# Patient Record
Sex: Female | Born: 1975 | Race: White | Hispanic: No | Marital: Single | Smoking: Never smoker
Health system: Southern US, Community
[De-identification: ages and names within clinical notes are randomized; demographics above are authoritative.]

## PROBLEM LIST (undated history)

## (undated) DIAGNOSIS — N809 Endometriosis, unspecified: Secondary | ICD-10-CM

## (undated) HISTORY — PX: TONSILLECTOMY: SUR1361

## (undated) HISTORY — PX: BUNIONECTOMY: SHX129

## (undated) HISTORY — DX: Endometriosis, unspecified: N80.9

---

## 2003-10-31 ENCOUNTER — Ambulatory Visit (HOSPITAL_COMMUNITY): Admission: RE | Admit: 2003-10-31 | Discharge: 2003-10-31 | Payer: Self-pay | Admitting: Internal Medicine

## 2004-08-26 ENCOUNTER — Ambulatory Visit: Payer: Self-pay | Admitting: Internal Medicine

## 2004-08-30 ENCOUNTER — Ambulatory Visit (HOSPITAL_COMMUNITY): Admission: RE | Admit: 2004-08-30 | Discharge: 2004-08-30 | Payer: Self-pay | Admitting: Internal Medicine

## 2005-03-19 ENCOUNTER — Ambulatory Visit: Payer: Self-pay | Admitting: Internal Medicine

## 2006-04-16 ENCOUNTER — Ambulatory Visit: Payer: Self-pay | Admitting: Internal Medicine

## 2006-04-28 ENCOUNTER — Ambulatory Visit: Payer: Self-pay | Admitting: Internal Medicine

## 2009-01-21 ENCOUNTER — Emergency Department (HOSPITAL_COMMUNITY): Admission: EM | Admit: 2009-01-21 | Discharge: 2009-01-21 | Payer: Self-pay | Admitting: Emergency Medicine

## 2009-05-23 ENCOUNTER — Encounter: Admission: RE | Admit: 2009-05-23 | Discharge: 2009-05-23 | Payer: Self-pay | Admitting: Specialist

## 2010-02-05 ENCOUNTER — Ambulatory Visit (HOSPITAL_COMMUNITY): Admission: RE | Admit: 2010-02-05 | Discharge: 2010-02-05 | Payer: Self-pay | Admitting: Family Medicine

## 2010-03-17 HISTORY — PX: TUBAL LIGATION: SHX77

## 2010-06-03 ENCOUNTER — Other Ambulatory Visit (HOSPITAL_COMMUNITY): Payer: Self-pay | Admitting: Family Medicine

## 2010-06-03 ENCOUNTER — Ambulatory Visit (HOSPITAL_COMMUNITY)
Admission: RE | Admit: 2010-06-03 | Discharge: 2010-06-03 | Disposition: A | Discharge status: Home / Self Care | Payer: BC Managed Care – PPO | Source: Ambulatory Visit | Attending: Family Medicine | Admitting: Family Medicine

## 2010-06-03 DIAGNOSIS — R52 Pain, unspecified: Secondary | ICD-10-CM

## 2010-06-03 DIAGNOSIS — M25559 Pain in unspecified hip: Secondary | ICD-10-CM | POA: Insufficient documentation

## 2010-08-02 NOTE — Op Note (Signed)
NAME:  Connie Burgess, Connie Burgess                       ACCOUNT NO.:  [id number]   MEDICAL RECORD NO.:  [id number]                   PATIENT TYPE:  AMB   LOCATION:  DAY                                  FACILITY:  [facility]   PHYSICIAN:  Lionel December, M.D.                 DATE OF BIRTH:  [birth year]   DATE OF PROCEDURE:  10/31/2003  DATE OF DISCHARGE:                                 OPERATIVE REPORT   PROCEDURE:  Flexible sigmoidoscopy.   INDICATIONS:  Forest is a 34 year old Caucasian female with intermittent  hematochezia over the last few weeks. She history of several episodes 5  years ago. She had colonoscopy which revealed erosions at the rectum and  sigmoid colon, but histology was negative for inflammatory bowel disease.  She is undergoing diagnostic flexible sigmoidoscopy. Procedure was reviewed  with the patient and informed consent was obtained.   PREOPERATIVE MEDICATIONS:  None.   FINDINGS:  Procedure performed in endoscopy suite. The patient was placed in  the left lateral position and rectal examination performed. No abnormality  noted on external or digital exam. Olympus video scope was placed in the  rectum. Rectal mucosa was normal; however, there were few tiny erosions  throughout the rectosigmoid junction. Scope was passed to the sigmoid colon.  Preparation was excellent until 45 cm where formed stool was noted. Mucosa  of the sigmoid colon reveals normal mucosa pattern. Most of the rectal  mucosa was also normal. Scope was retroflexed to examine anorectal junction.  While they were no hemorrhoids, there was erythema and some granularity at  the anorectal junction without obvious mucosal disruption. Endoscope was  straightened and withdrawn. The patient tolerated the procedure well.   FINAL DIAGNOSES:  Few tiny erosions in the rectosigmoid junction and some  mucosa irritation at anorectal junction. Suspect bleeding from either of  these could account for hematochezia.   RECOMMENDATIONS:  1. _____________ suppository 1 g per rectum q.h.s. for 2 weeks. If     necessary, she can extend it to 30 days.  2. I will be contacting patient with biopsy results for further     recommendations.  3. Citrucel 1 tablespoonful daily.  4. She will keep rigid diarrhea as to the frequency of these episodes.     Frequency of these episodes are reviewed with [place] in 3 to 6 months.      ___________________________________________                                            Lionel December, M.D.   NR/MEDQ  D:  10/31/2003  T:  10/31/2003  Job:  [id number]   cc:   Lorin Picket A. Gerda Diss, M.D.  [address]  [place]  [place] [address]  Fax: [phone]

## 2010-08-02 NOTE — Consult Note (Signed)
NAME:  Connie Burgess, KEESEY                       ACCOUNT NO.:  [id number]   MEDICAL RECORD NO.:  [id number]                   PATIENT TYPE:  AMB   LOCATION:  DAY                                  FACILITY:  [facility]   PHYSICIAN:  Lionel December, M.D.                 DATE OF BIRTH:  01/01/1976   DATE OF CONSULTATION:  DATE OF DISCHARGE:                                   CONSULTATION   REQUESTING PHYSICIAN:  Dr. Ralph Dowdy.   REASON FOR CONSULTATION:  Rectal bleeding.   HISTORY OF PRESENT ILLNESS:  Connie Burgess is a 35 year old female who reports  to our office for evaluation of rectal bleeding. She notes about a month ago  she was dripping blood. She states it was a large amount of bright red  bleeding on 3 to 4 separate episodes. She denies any history of constipation  or straining. She denies any history of known hemorrhoids. She denies any  diarrhea or abdominal pain. She does have some rectal pruritus. She denies  any fevers or chills. She denies any nausea, vomiting, heartburn,  indigestion, dysphagia, or odynophagia.   PAST MEDICAL HISTORY:  Positive for migraine headache as well as thyroid  disease. Last colonoscopy was by Dr. Karilyn Cota December 04, 1998. She did have  some nodularity to the transverse ileum felt to be normal as well as some  punctate erosions involving the rectal and distal sigmoid colon with  possible nonspecific colitis or early inflammatory bowel disease. Biopsy  from the small bowel revealed mild increased chronic inflammation and from  the sigmoid revealed focal increasing chronic inflammation with a lymphoid  follicle as well as scattered acute inflammation and focal edema.   PAST MEDICAL HISTORY:  1. Tonsillectomy as a child.  2. Left foot bunionectomy last year.   CURRENT MEDICATIONS:  _____________ birth control pills. Synthroid 1.5 mg  daily. Topamax 50 mg b.i.d.   ALLERGIES:  CEPHALEXIN.   FAMILY HISTORY:  No known family history of inflammatory bowel  disease or  colorectal carcinoma. Both mother and father are alive and are healthy. She  has two healthy sisters and one healthy brother.   SOCIAL HISTORY:  Ms. Faust has been married for 5 years. She lives at home  with her 50-year-old daughter as well as her husband and father-in-law. She  is employed full time as a Manufacturing systems engineer. She denies any tobacco use.  She does report occasional alcohol use once every 2 weeks. She denies any  drug use.   REVIEW OF SYSTEMS:  CONSTITUTIONAL:  Weight is stable. Appetite is good.  Denies any fatigue. ENDOCRINE:  She does have a history of hypothyroidism.  PSYCHOSOCIAL:  She does state that she is under a lot of stress recently,  especially with her father-in-law residing with her. She denies any anxiety  or depression. GASTROINTESTINAL:  See HPI. CARDIOVASCULAR:  She denies any  chest pain or palpitations. PULMONARY:  She  denies any shortness of breath,  dyspnea, cough, or hemoptysis.   PHYSICAL EXAMINATION:  VITAL SIGNS:  Weight 228.5 pounds, height 61 inches,  blood pressure 100/80, pulse 70.  GENERAL:  Connie Burgess is an obese, 35 year old, Caucasian female who is  alert and oriented, pleasant and cooperative in no acute distress.  HEENT:  Sclerae clear, nonicteric. Conjunctivae pink. Oropharynx pink and  moist without any lesions.  NECK:  Supple without any masses or thyromegaly.  CHEST:  Heart regular rate and rhythm with normal S1 and S2 without any  murmurs, clicks, rubs, or gallop.  LUNGS:  Clear to auscultation bilaterally.  ABDOMEN:  Protuberant and obese with positive bowel sounds x4. Nontender,  soft, nondistended without palpable mass or hepatosplenomegaly. No rebound  tenderness or guarding.  EXTREMITIES:  Trace edema bilaterally. Good pulses bilaterally.  RECTAL:  No external lesion noted. Good sphincter tone. No internal masses  or lesions palpated. There was small amount of light brown hemoccult  negative stool obtained  from the vault.   ASSESSMENT:  Connie Burgess is a 35 year old Caucasian female with intermittent  large volume bright red rectal bleeding on at least three or four occasions  in the last month. There is no associated abdominal pain. Colonoscopy and  biopsies from 2000 were very nonspecific. Bleeding may be a result of benign  anorectal source such as internal hemorrhoids; however, given her history of  this, I feel that inflammatory bowel disease should be ruled out as well. We  will ask Dr. Karilyn Cota to take another look at her colon at this time.   RECOMMENDATIONS:  1. Will schedule flexible sigmoidoscopy in the near future with Dr. Karilyn Cota.  2. Prescription is given for Anusol HC suppositories 1 per rectum q.h.s. for     14 days with one refill.  3. Further recommendations pending procedure.   We would like to thank Dr. Ralph Dowdy for allowing [place] to participate in the care  of [name].     ________________________________________  ___________________________________________  Nicholas Lose, N.P.                  Lionel December, M.D.   KC/MEDQ  D:  10/31/2003  T:  10/31/2003  Job:  [id number]   cc:   Ralph Dowdy, M.D.  [place]

## 2011-01-28 ENCOUNTER — Institutional Professional Consult (permissible substitution): Payer: BC Managed Care – PPO | Admitting: Pulmonary Disease

## 2011-02-04 ENCOUNTER — Ambulatory Visit (INDEPENDENT_AMBULATORY_CARE_PROVIDER_SITE_OTHER): Payer: BC Managed Care – PPO | Admitting: Pulmonary Disease

## 2011-02-04 ENCOUNTER — Encounter: Payer: Self-pay | Admitting: Pulmonary Disease

## 2011-02-04 ENCOUNTER — Ambulatory Visit: Payer: BC Managed Care – PPO | Attending: Pulmonary Disease | Admitting: Sleep Medicine

## 2011-02-04 VITALS — BP 110/62 | HR 112 | Temp 98.1°F | Ht 61.0 in | Wt 250.6 lb

## 2011-02-04 DIAGNOSIS — R0683 Snoring: Secondary | ICD-10-CM

## 2011-02-04 DIAGNOSIS — R0989 Other specified symptoms and signs involving the circulatory and respiratory systems: Secondary | ICD-10-CM

## 2011-02-04 DIAGNOSIS — G4733 Obstructive sleep apnea (adult) (pediatric): Secondary | ICD-10-CM | POA: Insufficient documentation

## 2011-02-04 DIAGNOSIS — R0609 Other forms of dyspnea: Secondary | ICD-10-CM

## 2011-02-04 NOTE — Progress Notes (Signed)
  Subjective:    Patient ID: Connie Burgess, female    DOB: 06-23-1975, 35 y.o.   MRN: [id number]  HPI The patient is a 35 year old female who I have been asked to see for possible sleep apnea.  The patient states that she has been told by her husband that she is a very loud snorer, and also has heard gurgling noises during sleep.  Despite losing 25 pounds, the patient states that her snoring has actually gotten worse rather than better.  She also notes that she can go periods without snoring as well.  The patient does not have a Burgess of awakenings during the night, but 50% of the time is unrested upon arising.  The patient feels that her alertness during the day is fairly normal, but does feel fatigued.  She denies any issues in the evenings watching television, or with driving.  The patient's Epworth score today is only 3.  Sleep Questionnaire: What time do you typically go to bed?( Between what hours) 9 to 10 pm How long does it take you to fall asleep? 10 to 20 mins How many times during the night do you wake up? 1 What time do you get out of bed to start your day? 8413 Do you drive or operate heavy machinery in your occupation? No How much has your weight changed (up or down) over the past two years? (In pounds) 25 lb (11.34 kg) Have you ever had a sleep study before? No Do you currently use CPAP? No Do you wear oxygen at any time? No     Review of Systems  Constitutional: Negative for fever and unexpected weight change.  HENT: Positive for ear pain, congestion, sore throat and sneezing. Negative for nosebleeds, rhinorrhea, trouble swallowing, dental problem, postnasal drip and sinus pressure.   Eyes: Negative for redness and itching.  Respiratory: Negative for cough, chest tightness, shortness of breath and wheezing.   Cardiovascular: Negative for palpitations and leg swelling.  Gastrointestinal: Negative for nausea and vomiting.  Genitourinary: Negative for dysuria.  Musculoskeletal:  Negative for joint swelling.  Skin: Negative for rash.  Neurological: Negative for headaches.  Hematological: Does not bruise/bleed easily.  Psychiatric/Behavioral: Negative for dysphoric mood. The patient is not nervous/anxious.        Objective:   Physical Exam Constitutional: obese female, no acute distress  HENT:  Nares patent without discharge, but deviated septum to left with significant narrowing.  Oropharynx without exudate, uvula is normal, but palate moderately elongated.   Eyes:  Perrla, eomi, no scleral icterus  Neck:  No JVD, no TMG  Cardiovascular:  Normal rate, regular rhythm, no rubs or gallops.  No murmurs        Intact distal pulses  Pulmonary :  Normal breath sounds, no stridor or respiratory distress   No rales, rhonchi, or wheezing  Abdominal:  Soft, nondistended, bowel sounds present.  No tenderness noted.   Musculoskeletal:  trace lower extremity edema noted.  Lymph Nodes:  No cervical lymphadenopathy noted  Skin:  No cyanosis noted  Neurologic:  Alert, appropriate, moves all 4 extremities without obvious deficit.         Assessment & Plan:

## 2011-02-04 NOTE — Assessment & Plan Note (Signed)
The pt has loud snoring by history, and nonrestorative sleep at times.  She has daytime fatigue that she feels is related to her job, but does not describe significant daytime sleepiness with inactivity.  She is obese and has an abnormal upper airway anatomy.  It is really unclear whether she truly has obstructive sleep apnea, or possibly the upper airway resistance syndrome.  The only way to know is to proceed with a sleep study, and the patient feels this is bothering her enough to do this.  I have also encouraged her to work aggressively on weight loss.

## 2011-02-04 NOTE — Patient Instructions (Signed)
Will set up for a sleep study.  Will call to discuss when results are available Work on weight reduction

## 2011-02-22 DIAGNOSIS — G4733 Obstructive sleep apnea (adult) (pediatric): Secondary | ICD-10-CM

## 2011-02-22 NOTE — Procedures (Signed)
Connie Burgess, Connie Burgess             ACCOUNT NO.:  [id number]  MEDICAL RECORD NO.:  [id number]          PATIENT TYPE:  OUT  LOCATION:  SLEEP LAB                     FACILITY:  [facility]  PHYSICIAN:  Barbaraann Share, MD,FCCPDATE OF BIRTH:  02/25/1976  DATE OF STUDY:  02/04/2011                           NOCTURNAL POLYSOMNOGRAM  REFERRING PHYSICIAN:  Barbaraann Share, MD,FCCP  REFERRING PHYSICIAN:  Barbaraann Share, MD, FCCP  INDICATION FOR STUDY:  Hypersomnia with sleep apnea.  EPWORTH SLEEPINESS SCORE:  3.  MEDICATIONS:  SLEEP ARCHITECTURE:  The patient had a total sleep time of 310 minutes with no slow-wave sleep and only 35 minutes of REM.  Sleep onset latency was normal at 8 minutes, and REM onset was prolonged at 165 minutes. Sleep efficiency was mildly reduced at 85%.  RESPIRATORY DATA:  The patient was found to have 19 obstructive apneas and 31 obstructive hypopneas, giving her an apnea-hypopnea index of 10 events per hour.  The events occurred in all body positions, but were more prominent in the supine position.  There was very loud snoring noted throughout.  The patient was also noted to have large numbers of respiratory effort related arousals, giving her a respiratory disturbance index of 23 events per hour.  OXYGEN DATA:  There was O2 desaturation as low as 81% with the patient's obstructive events.  CARDIAC DATA:  No clinically significant arrhythmias were noted.  MOVEMENT-PARASOMNIA:  The patient had no significant leg jerks or other abnormal behaviors.  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  There was O2 desaturation as low as 81%.  Treatment for this degree of sleep apnea can include a trial of weight loss alone, upper airway surgery, dental appliance, and also a CPAP.  Clinical correlation is  suggested.     Barbaraann Share, MD,FCCP Diplomate, American Board of Sleep Medicine Electronically Signed 02/25/2011 13:24:05    [facility]/MEDQ  D:  02/22/2011 17:30:47  T:  02/22/2011 21:37:06  Job:  [id number]

## 2011-02-25 ENCOUNTER — Telehealth: Payer: Self-pay | Admitting: *Deleted

## 2011-02-25 NOTE — Telephone Encounter (Signed)
Per KC, pt needs ov with him to discuss sleep study results.  LMOM for pt TCB 

## 2011-02-26 NOTE — Telephone Encounter (Signed)
Pt scheduled to see [facility] 03/23/10

## 2011-02-26 NOTE — Telephone Encounter (Signed)
LMOM for pt TCB 

## 2011-03-07 HISTORY — PX: INTRAUTERINE DEVICE INSERTION: SHX323

## 2011-03-24 ENCOUNTER — Encounter: Payer: Self-pay | Admitting: Pulmonary Disease

## 2011-03-24 ENCOUNTER — Ambulatory Visit (INDEPENDENT_AMBULATORY_CARE_PROVIDER_SITE_OTHER): Payer: BC Managed Care – PPO | Admitting: Pulmonary Disease

## 2011-03-24 VITALS — BP 122/82 | HR 89 | Temp 98.5°F | Ht 61.0 in | Wt 255.8 lb

## 2011-03-24 DIAGNOSIS — G4733 Obstructive sleep apnea (adult) (pediatric): Secondary | ICD-10-CM

## 2011-03-24 NOTE — Assessment & Plan Note (Signed)
The patient has mild obstructive sleep apnea by her recent sleep study, but clearly has disrupted sleep and daytime symptoms.  I have outlined an aggressive approach with upper airway surgery, dental appliance, and CPAP, while working on weight loss.  I have also discussed with her a more conservative approach which would be a trial of weight loss alone for the next 6 months.  After a long discussion, the patient is willing to give CPAP a try. I will set the patient up on cpap at a moderate pressure level to allow for desensitization, and will troubleshoot the device over the next 4-6weeks if needed.  The pt is to call me if having issues with tolerance.  Will then optimize the pressure once patient is able to wear cpap on a consistent basis.

## 2011-03-24 NOTE — Patient Instructions (Signed)
Will start on cpap.  Please call if having issues with tolerance Work on weight loss followup with me in 5weeks. 

## 2011-03-24 NOTE — Progress Notes (Signed)
  Subjective:    Patient ID: Connie Burgess, female    DOB: October 14, 1975, 36 y.o.   MRN: [id number]  HPI The patient comes in today for follow up after her sleep study in November.  She was found to have mild obstructive sleep apnea, with an AHI of 10 events per hour and an RDI of 23 events per hour.  I've reviewed the study in detail with her, and answered all of her questions.   Review of Systems  Constitutional: Negative for fever and unexpected weight change.  HENT: Positive for sinus pressure. Negative for ear pain, nosebleeds, congestion, sore throat, rhinorrhea, sneezing, trouble swallowing, dental problem and postnasal drip.   Eyes: Negative for redness and itching.  Respiratory: Positive for cough. Negative for chest tightness, shortness of breath and wheezing.   Cardiovascular: Negative for palpitations and leg swelling.  Gastrointestinal: Negative for nausea and vomiting.  Genitourinary: Negative for dysuria.  Musculoskeletal: Negative for joint swelling.  Skin: Negative for rash.  Neurological: Positive for headaches.  Hematological: Does not bruise/bleed easily.  Psychiatric/Behavioral: Negative for dysphoric mood. The patient is not nervous/anxious.        Objective:   Physical Exam Obese female in no acute distress Nose without purulence or discharge noted Lower extremities without edema, no cyanosis noted Alert and oriented, does not appear to be overly sleepy, moves all 4 extremities.       Assessment & Plan:

## 2011-04-09 IMAGING — CT CT ABD-PELV W/ CM
2 of 4 series · 17 of 46 positions shown, 19 images · IV contrast (Omnipaque 300)
Comparison: None.

CLINICAL DATA: Abdominal pain.  Pelvic pain.

CT ABDOMEN AND PELVIS WITH CONTRAST
TECHNIQUE: Multidetector CT imaging of the abdomen and pelvis was
performed following the standard protocol during bolus
administration of intravenous contrast.
Contrast: 100 ml Umnipaque-MAA.

[Series 2: abd_pel_with 5.0 b40f · axial · 0.84mm/px · z∈[-521,-81]mm · 14 of 97 slices shown, 16 images]
[im 5/97  soft-tissue]
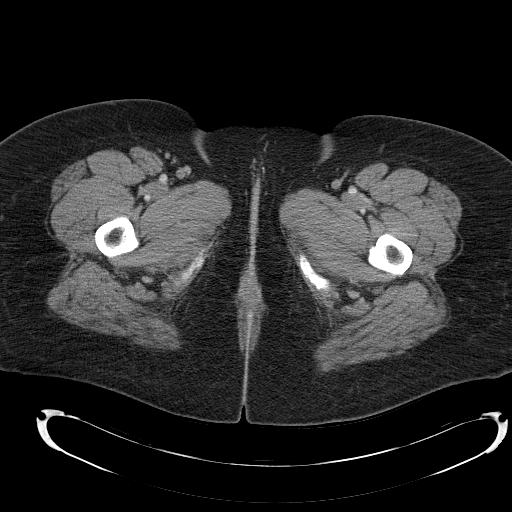
[im 5/97  bone]
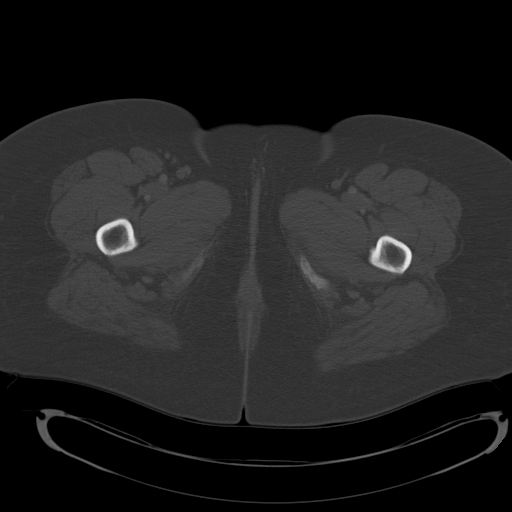
[im 13/97  soft-tissue]
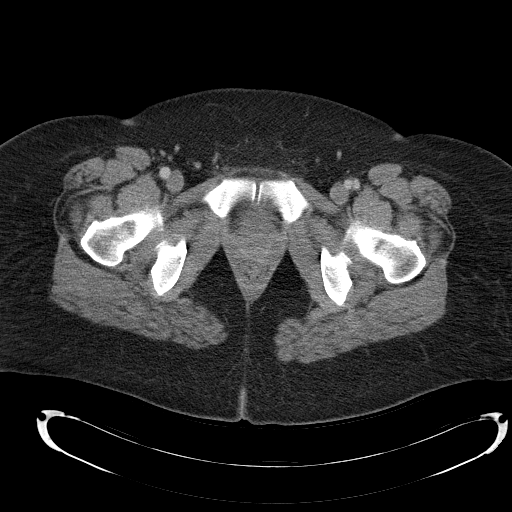
[im 21/97  soft-tissue]
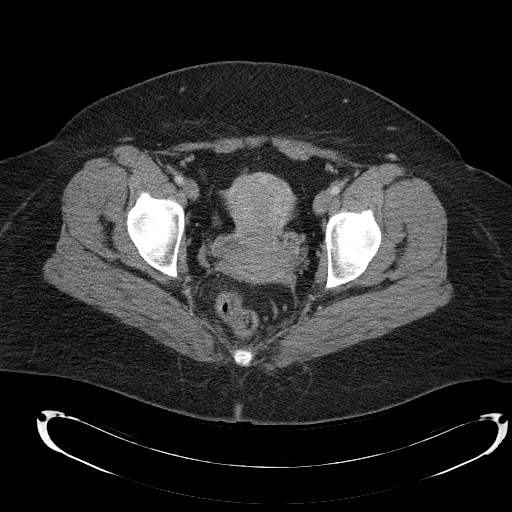
[im 25/97  soft-tissue]
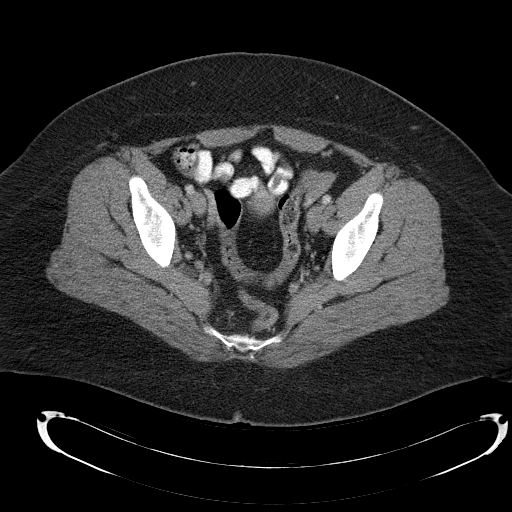
[im 33/97  soft-tissue]
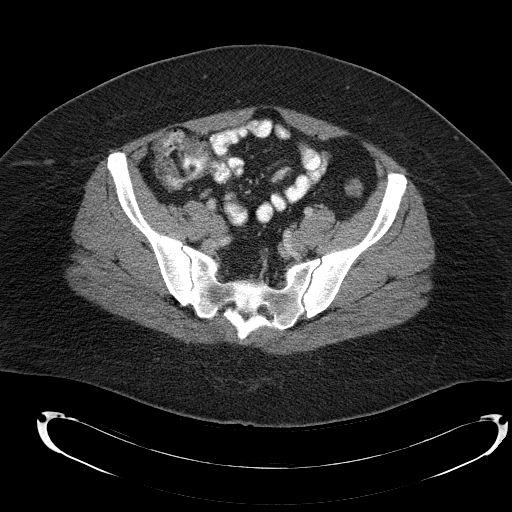
[im 41/97  soft-tissue]
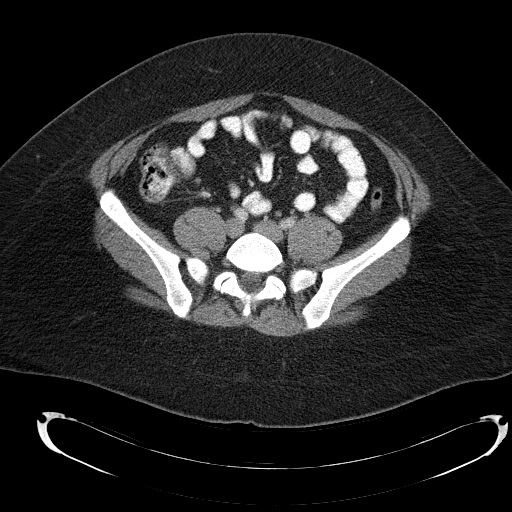
[im 45/97  soft-tissue]
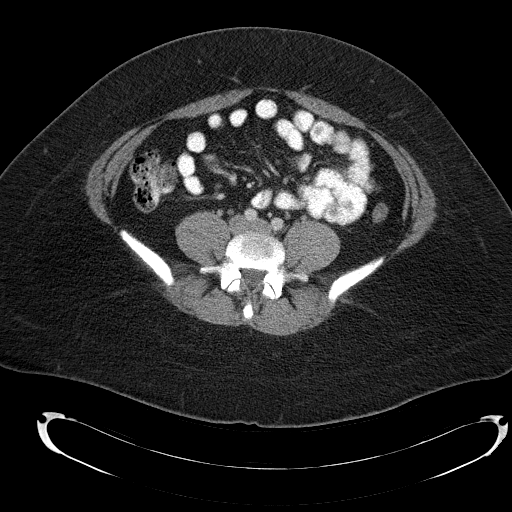
[im 53/97  soft-tissue]
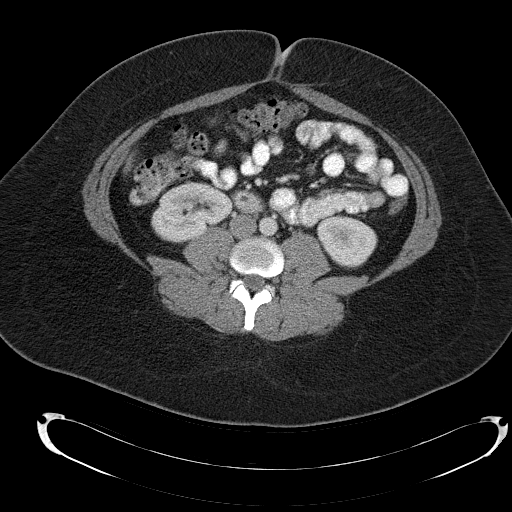
[im 57/97  soft-tissue]
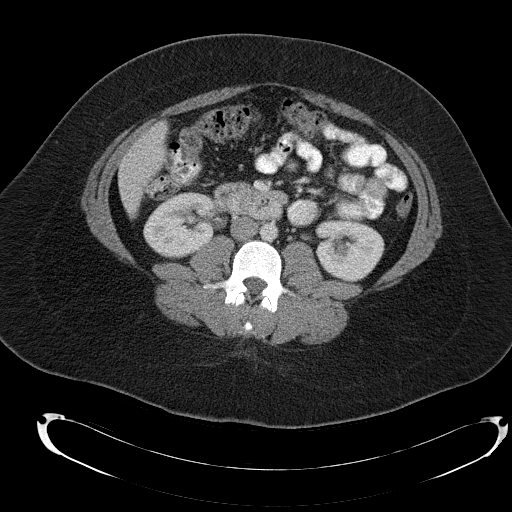
[im 57/97  bone]
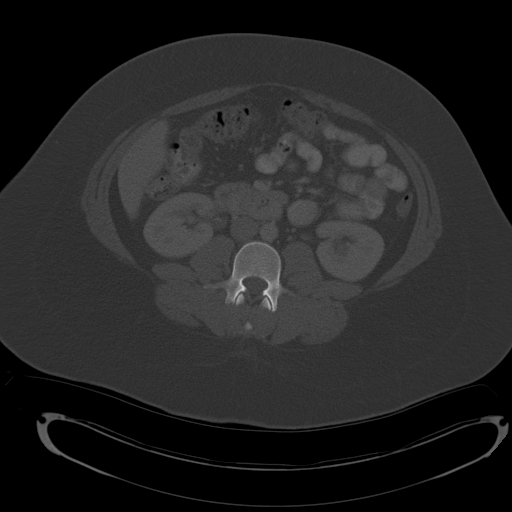
[im 65/97  soft-tissue]
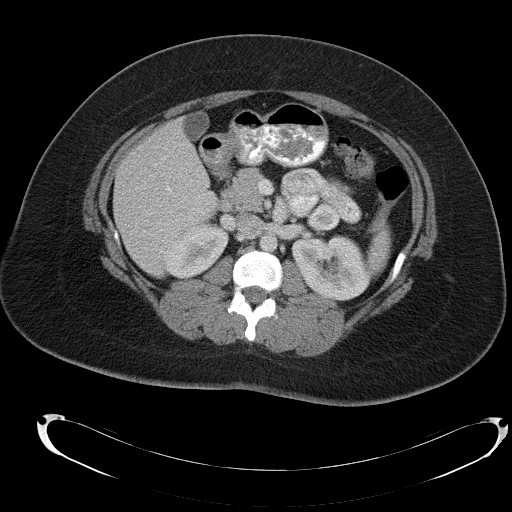
[im 73/97  soft-tissue]
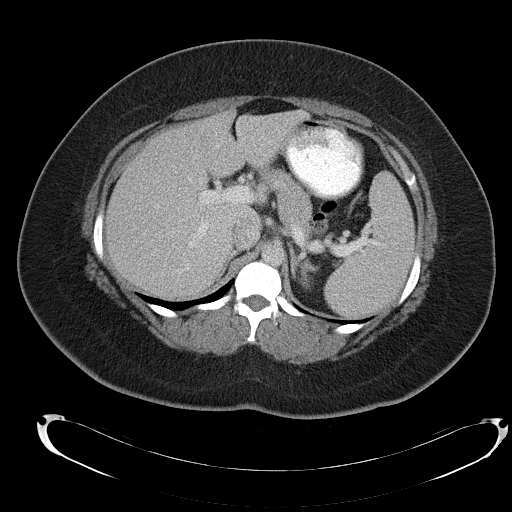
[im 77/97  soft-tissue]
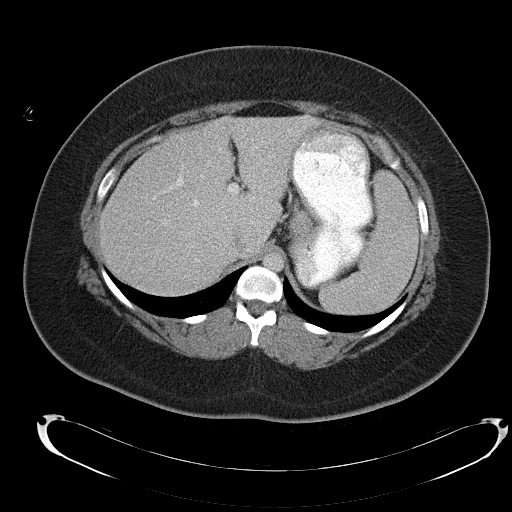
[im 85/97  soft-tissue]
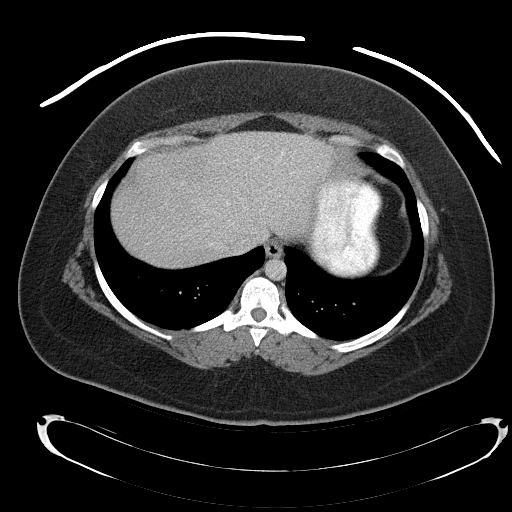
[im 93/97  soft-tissue]
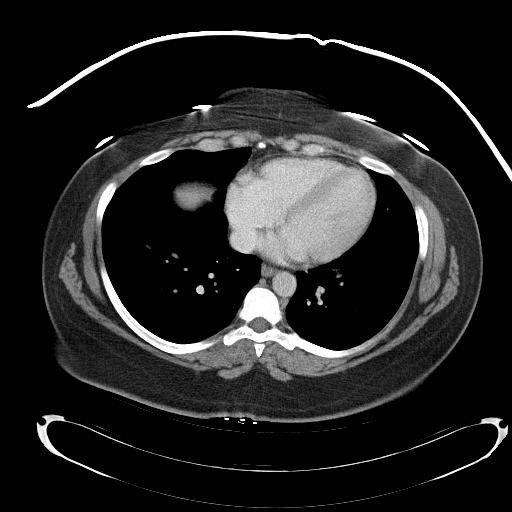

[Series 4: abd_pel_with 3.0 spo cor · coronal · 0.75mm/px · 3 of 99 slices shown]
[im 33/99  soft-tissue]
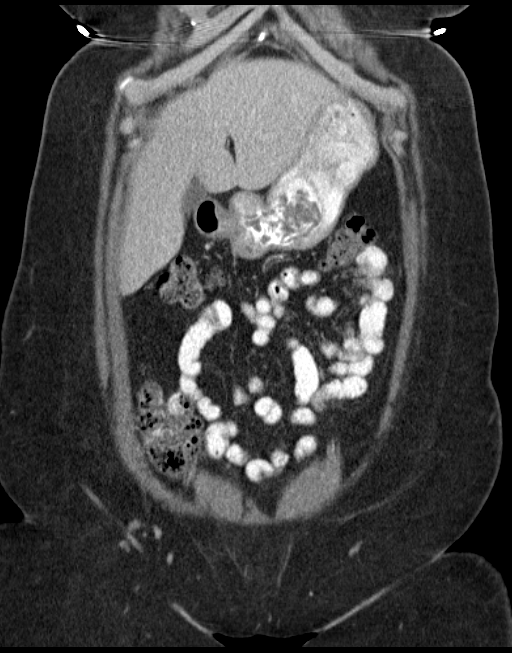
[im 44/99  soft-tissue]
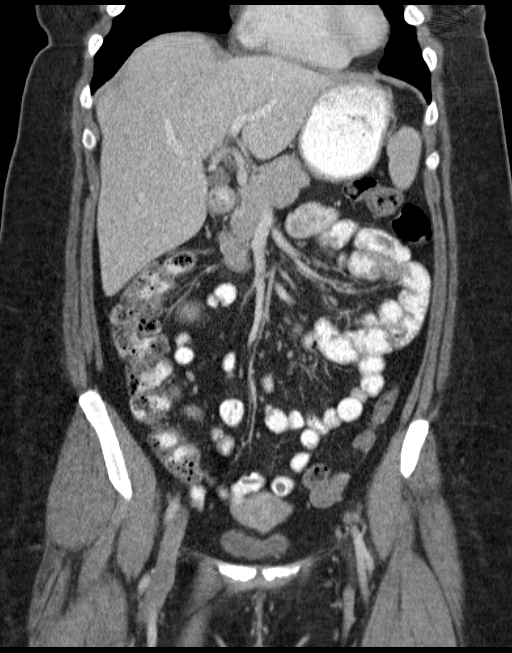
[im 55/99  soft-tissue]
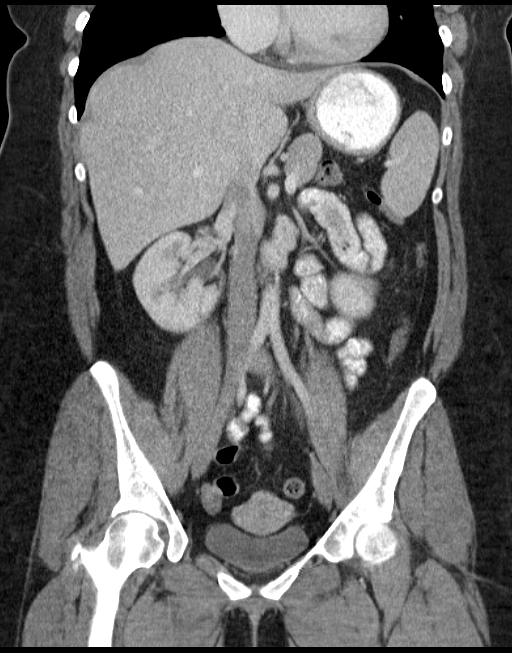

[17 of 46 positions shown; findings below may reference images not displayed]

FINDINGS: Lung bases clear.  Liver, gallbladder, spleen and
pancreas appear within normal limits.  Incidental note is made of
eventration of the right hemidiaphragm.  Common bile duct appears
normal.  Normal renal enhancement.  Distal esophagus, stomach and
small bowel appear normal.  Normal appendix.  No free fluid or free
air.  Uterus and adnexa appear within normal limits for age.  The
colon is unremarkable.  Urinary bladder within normal limits.
Bones are unremarkable.  No aggressive osseous lesions.
IMPRESSION: Negative CT abdomen and pelvis.

## 2011-04-28 ENCOUNTER — Ambulatory Visit: Payer: BC Managed Care – PPO | Admitting: Pulmonary Disease

## 2011-05-01 ENCOUNTER — Telehealth: Payer: Self-pay | Admitting: Pulmonary Disease

## 2011-05-01 NOTE — Telephone Encounter (Signed)
I spoke with pt and she stated she has a resmed and wanted to know if she needed to bring her chip in for her apt tomorrow. I advised her she can bring the chip in and we can get a download off of it for review. She voiced her understanding and had no further questions

## 2011-05-02 ENCOUNTER — Encounter: Payer: Self-pay | Admitting: Pulmonary Disease

## 2011-05-02 ENCOUNTER — Ambulatory Visit (INDEPENDENT_AMBULATORY_CARE_PROVIDER_SITE_OTHER): Payer: BC Managed Care – PPO | Admitting: Pulmonary Disease

## 2011-05-02 VITALS — BP 140/90 | HR 97 | Temp 97.9°F | Ht 61.0 in | Wt 250.8 lb

## 2011-05-02 DIAGNOSIS — G4733 Obstructive sleep apnea (adult) (pediatric): Secondary | ICD-10-CM

## 2011-05-02 NOTE — Patient Instructions (Signed)
Try current mask for next one week, and call to give me feedback Work on weight reduction If doing well, followup with me in 6mos.

## 2011-05-02 NOTE — Assessment & Plan Note (Signed)
The patient has been wearing CPAP very compliantly, but is having issues with mask fit and leaks.  She is also having a rash on her face in some areas from the initial mask.  She has recently gotten a new mask, and the first night went fairly well.  I have asked her to continue with this, but could look at a nasal pillow system if she continues to have issues.  Her CPAP pressure of 8 cm appears to be adequate by the download, but we will have to see how her symptoms do what she is wearing the CPAP with a non-leaking mask more consistently.  She is to call me next week with how things are going.  Once she gets a mass it is pitting her appropriately, I would probably formally optimize her pressure at home on auto set.

## 2011-05-02 NOTE — Progress Notes (Signed)
  Subjective:    Patient ID: Connie Burgess, female    DOB: 1975/03/24, 36 y.o.   MRN: [id number]  HPI The patient comes in today for followup of her known obstructive sleep apnea.  She was started on CPAP at the last visit, and has been wearing very compliantly by her recent download.  Her AHI also appears to be well-controlled.  Despite wearing the device, the patient does occasionally have breakthrough snoring according to her husband, and does not feel completely rested in the mornings upon arising.  She is having a Burgess of issues with the mask fit, and this is resulting in leaks.  She just recently received a new mask, and felt that it did well last night.   Review of Systems  Constitutional: Negative for fever and unexpected weight change.  HENT: Negative for ear pain, nosebleeds, congestion, sore throat, rhinorrhea, sneezing, trouble swallowing, dental problem, postnasal drip and sinus pressure.   Eyes: Negative for redness and itching.  Respiratory: Negative for cough, chest tightness, shortness of breath and wheezing.   Cardiovascular: Positive for leg swelling. Negative for palpitations.  Gastrointestinal: Negative for nausea and vomiting.  Genitourinary: Negative for dysuria.  Musculoskeletal: Negative for joint swelling.  Skin: Negative for rash.  Neurological: Positive for headaches.  Hematological: Does not bruise/bleed easily.  Psychiatric/Behavioral: Negative for dysphoric mood. The patient is not nervous/anxious.        Objective:   Physical Exam Obese female in no acute distress No skin breakdown or pressure necrosis from the CPAP mask.  The patient does have a patchy erythematous rash in various areas. Lower extremities without edema, no cyanosis Alert, does not appear to be sleepy, moves all 4 extremities.       Assessment & Plan:

## 2011-05-07 ENCOUNTER — Telehealth: Payer: Self-pay | Admitting: Pulmonary Disease

## 2011-05-07 NOTE — Telephone Encounter (Signed)
ATC NA WCB 

## 2011-05-08 NOTE — Telephone Encounter (Signed)
ATC NA and no option to leave a msg WCB 

## 2011-05-09 ENCOUNTER — Other Ambulatory Visit: Payer: Self-pay | Admitting: Pulmonary Disease

## 2011-05-09 DIAGNOSIS — G4733 Obstructive sleep apnea (adult) (pediatric): Secondary | ICD-10-CM

## 2011-05-09 NOTE — Telephone Encounter (Signed)
Called # provided above - was transferred to pt's VM - [facility]

## 2011-05-09 NOTE — Telephone Encounter (Signed)
Will refer to dental medicine for oral appliance.

## 2011-05-09 NOTE — Telephone Encounter (Signed)
I spoke with pt and she states she believes she is allergic to her mask and wants to be referred for oral appliance. Pt states she has gotten a new mask and it leaks and has caused her to break out on her face. Pt states the bumps are painful and sore from the breakout and has not healed. Due to this she has been unable to use her machine. Please advise Dr. Shelle Iron, thanks

## 2011-08-05 IMAGING — CR DG HIP COMPLETE 2+V*R*
3 series · 3 of 3 positions shown · non-contrast
Comparison: 02/05/2010 CT.

CLINICAL DATA: Right hip pain.  No injury.

RIGHT HIP - COMPLETE 2+ VIEW

[view not recorded (1 of 3)]
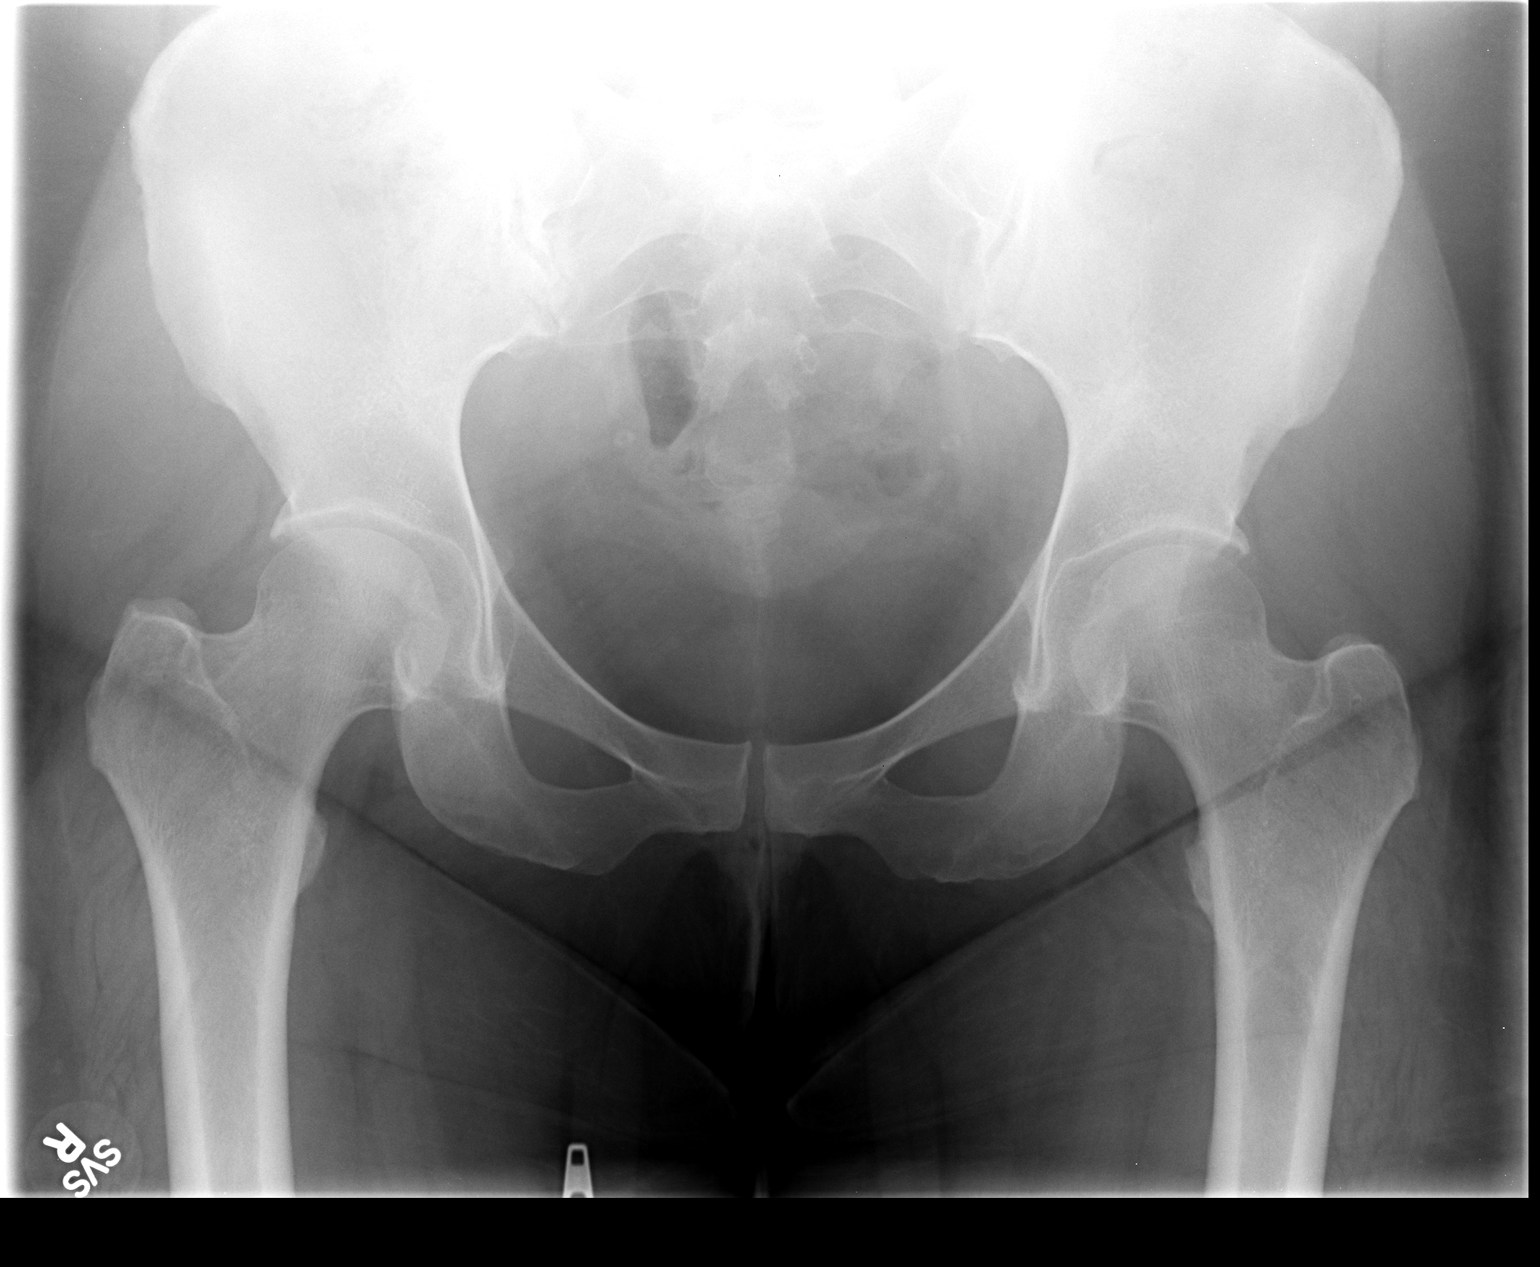

[view not recorded (2 of 3)]
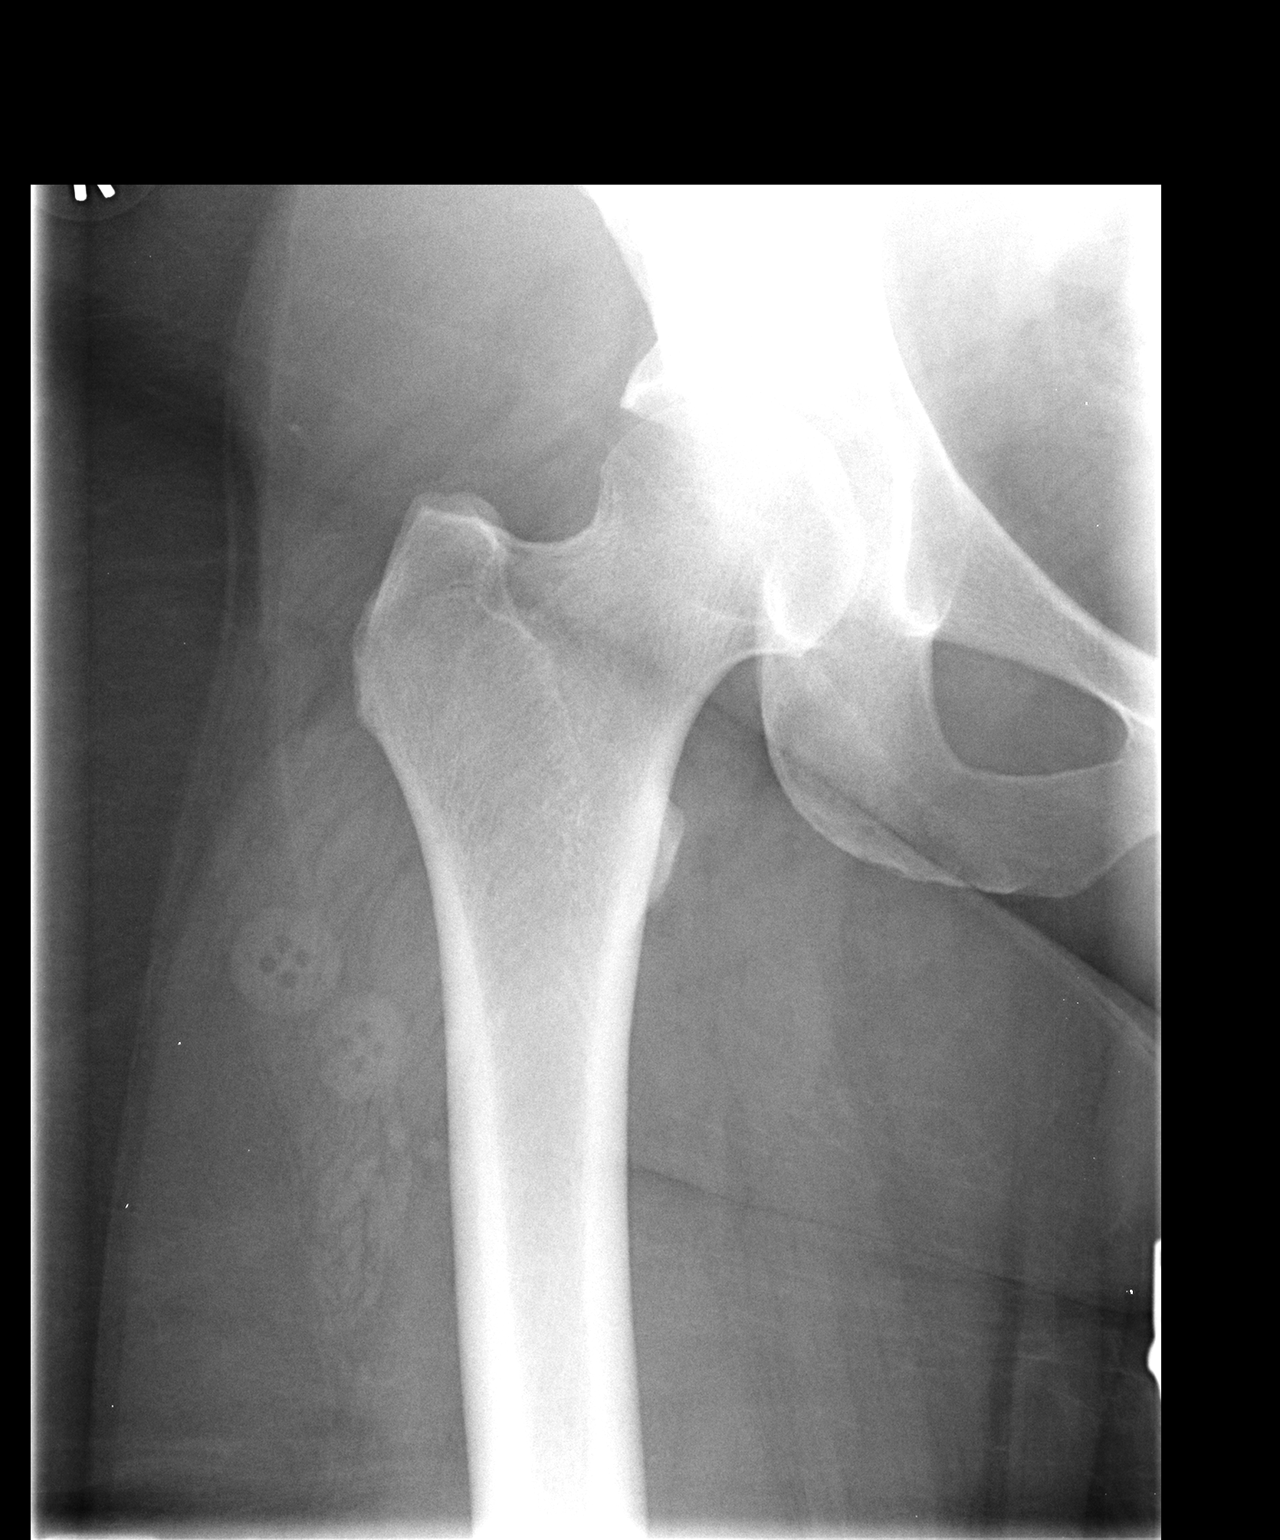

[view not recorded (3 of 3)]
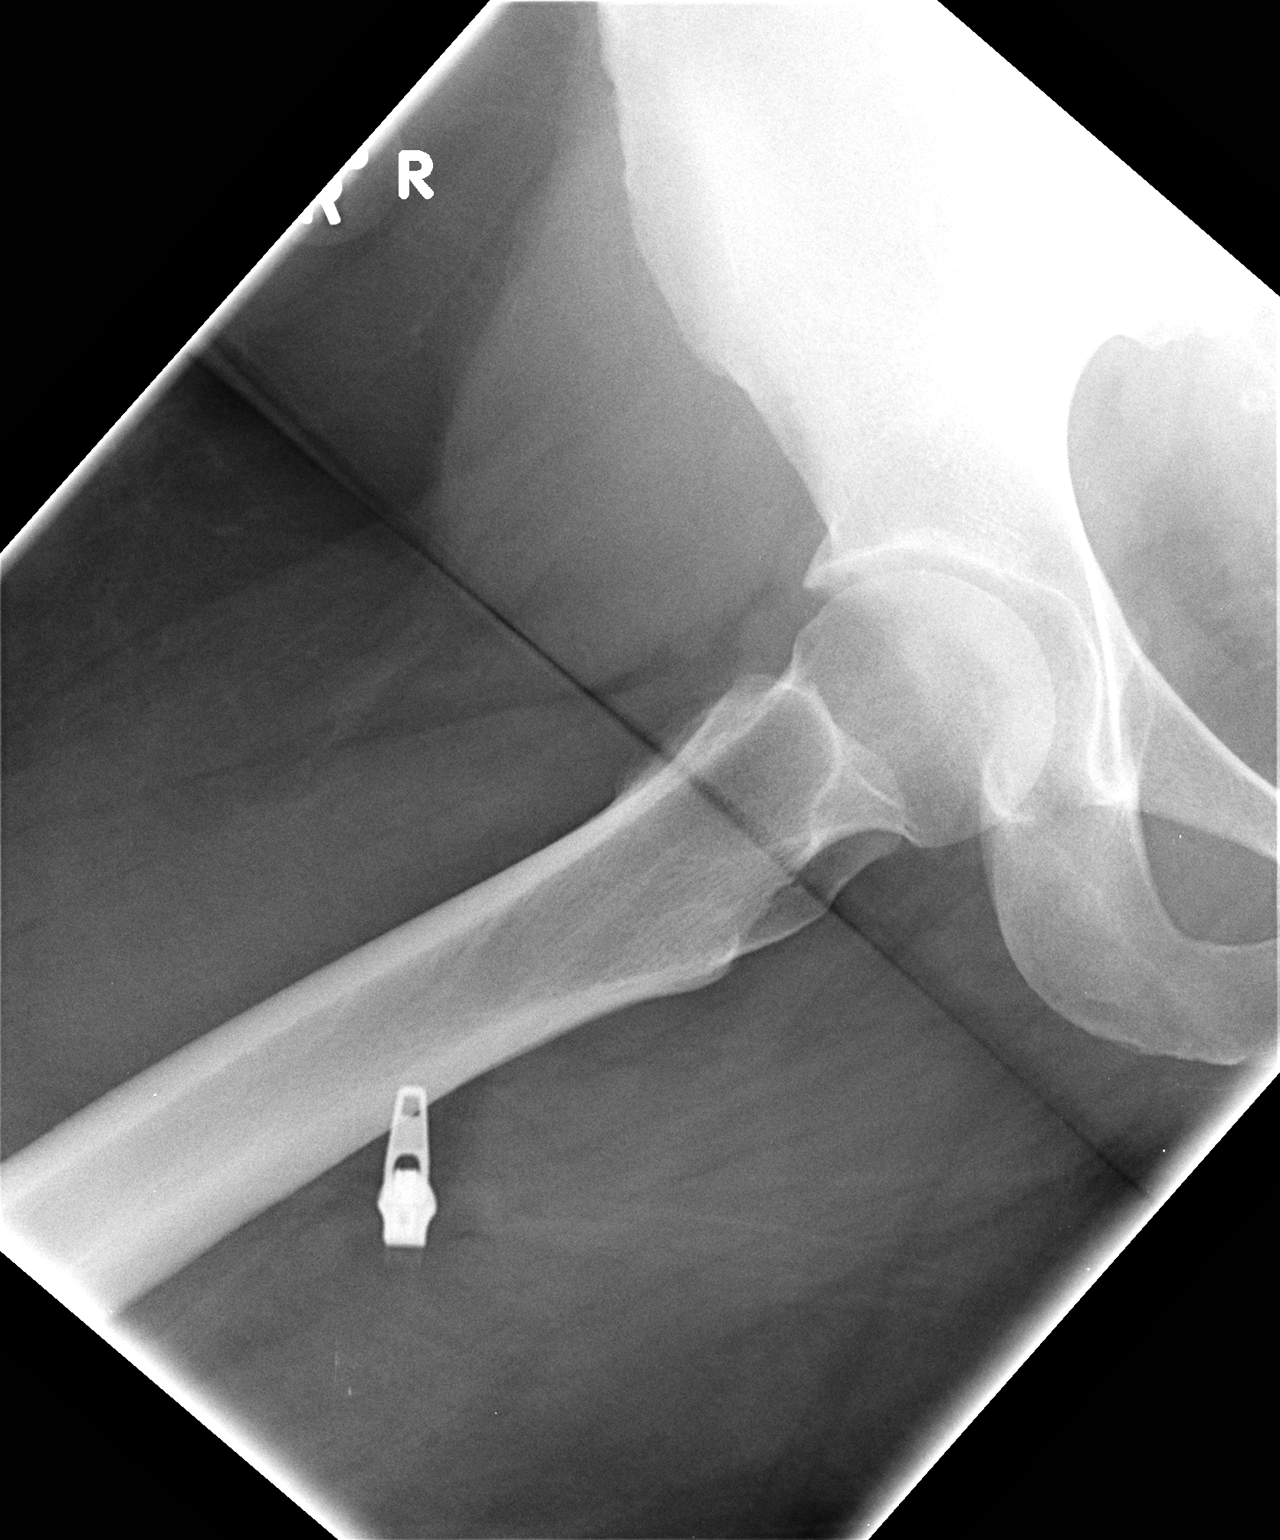

[3 of 3 positions shown; findings below may reference images not displayed]

FINDINGS: No fracture dislocation.  No significant hip joint
degenerative changes.  No plain film evidence of femoral head
avascular necrosis.
IMPRESSION: Negative plain film exam of the right hip.

## 2011-10-30 ENCOUNTER — Ambulatory Visit: Payer: BC Managed Care – PPO | Admitting: Pulmonary Disease

## 2011-11-21 ENCOUNTER — Other Ambulatory Visit: Payer: Self-pay | Admitting: Pulmonary Disease

## 2011-11-21 ENCOUNTER — Encounter: Payer: Self-pay | Admitting: Pulmonary Disease

## 2011-11-21 ENCOUNTER — Ambulatory Visit (INDEPENDENT_AMBULATORY_CARE_PROVIDER_SITE_OTHER): Payer: BC Managed Care – PPO | Admitting: Pulmonary Disease

## 2011-11-21 VITALS — BP 132/72 | HR 59 | Temp 97.8°F | Ht 62.0 in | Wt 250.4 lb

## 2011-11-21 DIAGNOSIS — G4733 Obstructive sleep apnea (adult) (pediatric): Secondary | ICD-10-CM

## 2011-11-21 NOTE — Assessment & Plan Note (Signed)
The patient is doing very well with her dental appliance, and has seen significant improvement in her symptoms.  She has had a followup sleep test which verified adequate control of her sleep-disordered breathing.  I have asked her to continue with her dental appliance, keep followup with internal medicine, and to see me back in one year.  I've also encouraged her to work aggressively on weight loss.

## 2011-11-21 NOTE — Patient Instructions (Addendum)
Continue with dental appliance Work on weight loss  followup with me in one year.

## 2011-11-21 NOTE — Progress Notes (Signed)
  Subjective:    Patient ID: Connie Burgess, female    DOB: 04-17-1975, 36 y.o.   MRN: [id number]  HPI Patient comes in today for followup of her known obstructive sleep apnea.  She was intolerant to CPAP, and has been fitted with a dental appliance which she is tolerating quite well.  She apparently has had a followup on sleep test which showed adequate control of her sleep disordered breathing.  Patient feels that she is sleeping better, and has increased alertness during the day.  Her daughter has commented that she is not snoring, and that she is sleeping much more quietly.   Review of Systems  Constitutional: Negative for fever and unexpected weight change.  HENT: Negative for ear pain, nosebleeds, congestion, sore throat, rhinorrhea, sneezing, trouble swallowing, dental problem, postnasal drip and sinus pressure.   Eyes: Negative for redness and itching.  Respiratory: Negative for cough, chest tightness, shortness of breath and wheezing.   Cardiovascular: Negative for palpitations and leg swelling.  Gastrointestinal: Negative for nausea and vomiting.  Genitourinary: Negative for dysuria.  Musculoskeletal: Negative for joint swelling.  Skin: Negative for rash.  Neurological: Negative for headaches.  Hematological: Bruises/bleeds easily.  Psychiatric/Behavioral: Negative for dysphoric mood. The patient is not nervous/anxious.        Objective:   Physical Exam Obese female in no acute distress Nose without purulence or discharge noted Lower extremities with mild edema, cyanosis Alert and oriented, moves all 4 extremities.       Assessment & Plan:

## 2011-11-25 ENCOUNTER — Telehealth: Payer: Self-pay | Admitting: Pulmonary Disease

## 2011-11-25 NOTE — Telephone Encounter (Signed)
Pt notified of sleep test results per [facility] and verbalized understanding.

## 2011-11-25 NOTE — Telephone Encounter (Signed)
LMOMTCB x 1 

## 2011-11-25 NOTE — Telephone Encounter (Signed)
Please let pt know that I received her home sleep test from June, and her sleep apnea is very well controlled with the appliance.  She only had 2 obstructive events per hour of sleep, with normal being anything less than 5 per hour.

## 2011-11-28 ENCOUNTER — Encounter: Payer: Self-pay | Admitting: Pulmonary Disease

## 2012-06-24 ENCOUNTER — Telehealth: Payer: Self-pay | Admitting: Nurse Practitioner

## 2012-06-24 NOTE — Telephone Encounter (Signed)
LMTCB or if gets worse to go to urgent care or ER.

## 2012-06-25 ENCOUNTER — Encounter: Payer: Self-pay | Admitting: Gynecology

## 2012-06-25 ENCOUNTER — Ambulatory Visit (INDEPENDENT_AMBULATORY_CARE_PROVIDER_SITE_OTHER): Payer: BC Managed Care – PPO | Admitting: Gynecology

## 2012-06-25 VITALS — BP 122/78 | Wt 253.0 lb

## 2012-06-25 DIAGNOSIS — R109 Unspecified abdominal pain: Secondary | ICD-10-CM

## 2012-06-25 DIAGNOSIS — N390 Urinary tract infection, site not specified: Secondary | ICD-10-CM

## 2012-06-25 LAB — POCT URINALYSIS DIPSTICK
Blood, UA: 2
pH, UA: 5

## 2012-06-25 LAB — POCT WET PREP (WET MOUNT): Clue Cells Wet Prep Whiff POC: NEGATIVE

## 2012-06-25 NOTE — Telephone Encounter (Signed)
Patient reports history of cyst several years ago that was not diagnosed until she had to have surgery.  Reports similar symptoms for 1 1/2 weeks that seem to be increasing.  Intermittent RLQ pain with episodes of sharp pain and it is increasing in frequency.  Mirena in place.  LMP approx 2 weeks ago.  Denies fever.  Treated herself with OTC  Miconazole for vaginal discharge. OV today at 1215 with Dr Farrel Gobble.

## 2012-06-25 NOTE — Progress Notes (Signed)
37 y.o. Single Caucasian female   G1P1001 here for complaint of pelvic pain.  Pain started 10 days ago.  Pain is primarily located RLQ and suprapubic and is described as intermittent, sharp and dull.  Pain is aggravated by heavy activity and is associated with bloating/abdominal distension, constipation and diarrhea.  She has not tried any treatments. Pt denies dysruria, fever, chills.  Pt noted vaginal discharge cloudy, thick white treated with 3 d mycolog, partial relief.   Pt sexually active with new partner, was tested for STD after sexual contact, reports not using condoms.  Pt states LMP unusual in that is it lighter and longer than typical since IUD placed.  Patient is not sexually active with last sexual activity 1 months ago.   ROS:  Nausea:  no  Fever:  no  Weight loss/gain:  no  Vaginal discharge or odor:  yes  Other:  none  All other ROS questions are negative except as per HPI.  Exam:   BP 122/78  Wt 253 lb (114.76 kg)  BMI 46.26 kg/m2 General appearance: alert, cooperative and appears stated age Abdomen:  soft, non-tender; bowel sounds normal; no masses,  no organomegaly  Pelvic: External genitalia:  no lesions              Urethra: normal appearing urethra with no masses, tenderness or lesions              Bartholins and Skenes: normal                 Vagina: normal appearing vagina with normal color and discharge, no lesions              Cervix: normal appearance and IUD strings seen, no CMT, no discharge              Pap taken: no Bimanual Exam:  Uterus:  uterus is normal size, shape, consistency and nontender                               Adnexa:    normal adnexa in size, nontender and no masses                               Rectovaginal: Deferred                               Anus:  defer exam  Wet prep was obtained.  Results:  no pathogens and pH 4.5  A: history of ovarian cyst in past, unusual LMP     Questionable UTI  P: Labs:  Urine culture     Radiology:   Pelvic ultrasound if culture negative     Medications:  none     Patient will follow-up in 2w if no ultrasound done, agrees to plan.       An After Visit Summary was printed and given to the patient.

## 2012-06-25 NOTE — Patient Instructions (Addendum)
Pelvic Pain Pelvic pain is pain below the belly button and located between your hips. Acute pain may last a few hours or days. Chronic pelvic pain may last weeks and months. The cause may be different for different types of pain. The pain may be dull or sharp, mild or severe and can interfere with your daily activities. Write down and tell your caregiver:   Exactly where the pain is located.  If it comes and goes or is there all the time.  When it happens (with sex, urination, bowel movement, etc.)  If the pain is related to your menstrual period or stress. Your caregiver will take a full history and do a complete physical exam and Pap test. CAUSES   Painful menstrual periods (dysmenorrhea).  Normal ovulation (Mittelschmertz) that occurs in the middle of the menstrual cycle every month.  The pelvic organs get engorged with blood just before the menstrual period (pelvic congestive syndrome).  Scar tissue from an infection or past surgery (pelvic adhesions).  Cancer of the female pelvic organs. When there is pain with cancer, it has been there for a long time.  The lining of the uterus (endometrium) abnormally grows in places like the pelvis and on the pelvic organs (endometriosis).  A form of endometriosis with the lining of the uterus present inside of the muscle tissue of the uterus (adenomyosis).  Fibroid tumor (noncancerous) in the uterus.  Bladder problems such as infection, bladder spasms of the muscle tissue of the bladder.  Intestinal problems (irritable bowel syndrome, colitis, an ulcer or gastrointestinal infection).  Polyps of the cervix or uterus.  Pregnancy in the tube (ectopic pregnancy).  The opening of the cervix is too small for the menstrual blood to flow through it (cervical stenosis).  Physical or sexual abuse (past or present).  Musculo-skeletal problems from poor posture, problems with the vertebrae of the lower back or the uterine pelvic muscles falling  (prolapse).  Psychological problems such as depression or stress.  IUD (intrauterine device) in the uterus. DIAGNOSIS  Tests to make a diagnosis depends on the type, location, severity and what causes the pain to occur. Tests that may be needed include:  Blood tests.  Urine tests  Ultrasound.  X-rays.  CT Scan.  MRI.  Laparoscopy.  Major surgery. TREATMENT  Treatment will depend on the cause of the pain, which includes:  Prescription or over-the-counter pain medication.  Antibiotics.  Birth control pills.  Hormone treatment.  Nerve blocking injections.  Physical therapy.  Antidepressants.  Counseling with a psychiatrist or psychologist.  Minor or major surgery. HOME CARE INSTRUCTIONS   Only take over-the-counter or prescription medicines for pain, discomfort or fever as directed by your caregiver.  Follow your caregiver's advice to treat your pain.  Rest.  Avoid sexual intercourse if it causes the pain.  Apply warm or cold compresses (which ever works best) to the pain area.  Do relaxation exercises such as yoga or meditation.  Try acupuncture.  Avoid stressful situations.  Try group therapy.  If the pain is because of a stomach/intestinal upset, drink clear liquids, eat a bland light food diet until the symptoms go away. SEEK MEDICAL CARE IF:   You need stronger prescription pain medication.  You develop pain with sexual intercourse.  You have pain with urination.  You develop a temperature of 102 F (38.9 C) with the pain.  You are still in pain after 4 hours of taking prescription medication for the pain.  You need depression medication.    Your IUD is causing pain and you want it removed. SEEK IMMEDIATE MEDICAL CARE IF:  You develop very severe pain or tenderness.  You faint, have chills, severe weakness or dehydration.  You develop heavy vaginal bleeding or passing solid tissue.  You develop a temperature of 102 F (38.9 C)  with the pain.  You have blood in the urine.  You are being physically or sexually abused.  You have uncontrolled vomiting and diarrhea.  You are depressed and afraid of harming yourself or someone else. Document Released: 04/10/2004 Document Revised: 05/26/2011 Document Reviewed: 01/06/2008 [facility] Patient Information 2013 [place], [place].   Pelvic rest

## 2012-06-25 NOTE — Addendum Note (Signed)
Addended by: Clide Dales R on: 06/25/2012 04:53 PM   Modules accepted: Orders

## 2012-06-27 LAB — URINE CULTURE: Colony Count: 3000

## 2012-06-28 ENCOUNTER — Telehealth: Payer: Self-pay | Admitting: *Deleted

## 2012-06-28 NOTE — Telephone Encounter (Signed)
Left Message To Call Back  

## 2012-06-30 ENCOUNTER — Other Ambulatory Visit: Payer: Self-pay | Admitting: Gynecology

## 2012-06-30 DIAGNOSIS — N83209 Unspecified ovarian cyst, unspecified side: Secondary | ICD-10-CM

## 2012-06-30 DIAGNOSIS — N858 Other specified noninflammatory disorders of uterus: Secondary | ICD-10-CM

## 2012-06-30 DIAGNOSIS — N9489 Other specified conditions associated with female genital organs and menstrual cycle: Secondary | ICD-10-CM

## 2012-06-30 DIAGNOSIS — N949 Unspecified condition associated with female genital organs and menstrual cycle: Secondary | ICD-10-CM

## 2012-06-30 DIAGNOSIS — N839 Noninflammatory disorder of ovary, fallopian tube and broad ligament, unspecified: Secondary | ICD-10-CM

## 2012-07-05 ENCOUNTER — Encounter: Payer: Self-pay | Admitting: *Deleted

## 2012-07-20 ENCOUNTER — Telehealth: Payer: Self-pay | Admitting: *Deleted

## 2012-07-20 ENCOUNTER — Encounter: Payer: Self-pay | Admitting: Nurse Practitioner

## 2012-07-20 NOTE — Telephone Encounter (Signed)
Office sched conflict. Call to patietn to resched appt for tomm.  LMTCM

## 2012-07-20 NOTE — Telephone Encounter (Signed)
2nd call to pts cell number, LMTCM to resched appt for U/S due to dr r/s.  Work number is a school so no message left.

## 2012-07-21 ENCOUNTER — Other Ambulatory Visit: Payer: BC Managed Care – PPO | Admitting: Gynecology

## 2012-07-21 ENCOUNTER — Other Ambulatory Visit: Payer: BC Managed Care – PPO

## 2012-07-27 ENCOUNTER — Other Ambulatory Visit: Payer: Self-pay | Admitting: Gynecology

## 2012-07-27 ENCOUNTER — Ambulatory Visit (INDEPENDENT_AMBULATORY_CARE_PROVIDER_SITE_OTHER): Payer: BC Managed Care – PPO

## 2012-07-27 ENCOUNTER — Ambulatory Visit (INDEPENDENT_AMBULATORY_CARE_PROVIDER_SITE_OTHER): Payer: BC Managed Care – PPO | Admitting: Gynecology

## 2012-07-27 ENCOUNTER — Encounter: Payer: Self-pay | Admitting: Gynecology

## 2012-07-27 DIAGNOSIS — N858 Other specified noninflammatory disorders of uterus: Secondary | ICD-10-CM

## 2012-07-27 DIAGNOSIS — N949 Unspecified condition associated with female genital organs and menstrual cycle: Secondary | ICD-10-CM

## 2012-07-27 DIAGNOSIS — N859 Noninflammatory disorder of uterus, unspecified: Secondary | ICD-10-CM

## 2012-07-27 DIAGNOSIS — N8300 Follicular cyst of ovary, unspecified side: Secondary | ICD-10-CM

## 2012-07-27 DIAGNOSIS — R102 Pelvic and perineal pain: Secondary | ICD-10-CM

## 2012-07-27 DIAGNOSIS — R1031 Right lower quadrant pain: Secondary | ICD-10-CM

## 2012-07-27 NOTE — Progress Notes (Signed)
  Pt presents for u/s done for RLQ pain, pt reports having a cyst drained on the right in the past and a questionable diagnosis of endometriosis.  Pt states that she con't to have pain post-op and was ultimately sent to [facility] for pelvic floor PT which she did for 78m and was released.  She has not maintained her exercises after discharge.  We reviewed her u/s findings.  Her right adnexa was normal her left was remarkable for placement high in pelvis which required a transabdominal  U/s to fully assess but no cysts were noted.   Pt's chart was reviewed peripherally but we will do a more complete review after this ov, no operative note or pathology was noted and a record release from [facility] was done. Pt reports history of recurrent abnormal urinalysis with negative cultures which she also had here once.  Pt reports PCP usually sees her for these sx.  Pt also reports nonspecific bowel issues but no diagnosis of irritable bowel. I had a long discussion with pt regarding the DDx of her pelvic pain-cannot rule out interstitial cystitis, recurrence of pelvic floor dyssyergy or GI etiology, or endometriosis. For now, the IUD is properly placed and the ovaries are normal.  Suggest we get the records from surgery and consider referral to urology or PT. She is agreeable  Length of visit >47min discussing pelvic pain

## 2012-08-05 ENCOUNTER — Telehealth: Payer: Self-pay | Admitting: *Deleted

## 2012-08-05 NOTE — Telephone Encounter (Signed)
Patient returned Jasmine's phone call.

## 2012-08-05 NOTE — Telephone Encounter (Signed)
Left Message To Call Back Re: Dr. Farrel Gobble has reviewed the procedure note and given her review she wants to know what patient would like to have physical therapy or a urology consult?

## 2012-08-05 NOTE — Telephone Encounter (Signed)
S/w patient in regards to f/u with urology consult or physical therapy; pt says she was under the impression that you were going to look at her previous procedure note and then give her a call first before she went with her decision? Please advise

## 2012-08-05 NOTE — Telephone Encounter (Signed)
Left Message To Call Back  

## 2012-08-10 NOTE — Telephone Encounter (Signed)
Pt says she would like to have a urology consult.

## 2012-08-13 NOTE — Telephone Encounter (Signed)
Need cahrt back so I can make Urol referral

## 2012-09-13 ENCOUNTER — Telehealth: Payer: Self-pay | Admitting: Gynecology

## 2012-09-13 NOTE — Telephone Encounter (Signed)
Patient has results on ultrasound. But, has not been contacted beyond this point and has waiting to hear from someone for the next step.

## 2012-09-14 NOTE — Telephone Encounter (Signed)
Lets make appt with [facility], call them instead of faxing form, for Pelvic pain without identifiable GYN cause and history of abnormal U/A with negative cultures. Then route to Dr Farrel Gobble for sign off.

## 2012-09-14 NOTE — Telephone Encounter (Signed)
Spoke with pt about referral to [facility]. Appt with Dr. Sherron Monday 10-11-12 at 9:00. Pt cannot go that day. Gave pt phone number to call and reschedule.

## 2012-11-01 ENCOUNTER — Other Ambulatory Visit (HOSPITAL_COMMUNITY): Payer: Self-pay | Admitting: Family Medicine

## 2012-11-10 ENCOUNTER — Encounter: Payer: Self-pay | Admitting: Nurse Practitioner

## 2012-12-16 ENCOUNTER — Ambulatory Visit: Payer: Self-pay | Admitting: Nurse Practitioner

## 2012-12-22 ENCOUNTER — Encounter: Payer: Self-pay | Admitting: Nurse Practitioner

## 2012-12-22 ENCOUNTER — Ambulatory Visit (INDEPENDENT_AMBULATORY_CARE_PROVIDER_SITE_OTHER): Payer: BC Managed Care – PPO | Admitting: Nurse Practitioner

## 2012-12-22 VITALS — BP 120/76 | HR 64 | Ht 61.75 in | Wt 257.0 lb

## 2012-12-22 DIAGNOSIS — Z0000 Encounter for general adult medical examination without abnormal findings: Secondary | ICD-10-CM

## 2012-12-22 DIAGNOSIS — Z01419 Encounter for gynecological examination (general) (routine) without abnormal findings: Secondary | ICD-10-CM

## 2012-12-22 LAB — POCT URINALYSIS DIPSTICK
Bilirubin, UA: NEGATIVE
Glucose, UA: NEGATIVE
Ketones, UA: NEGATIVE
Protein, UA: NEGATIVE

## 2012-12-22 LAB — HEMOGLOBIN, FINGERSTICK: Hemoglobin, fingerstick: 14.6 g/dL (ref 12.0–16.0)

## 2012-12-22 MED ORDER — METRONIDAZOLE 0.75 % VA GEL: 1.0000 | Freq: Every day | VAGINAL | Status: DC

## 2012-12-22 NOTE — Patient Instructions (Signed)

## 2012-12-22 NOTE — Progress Notes (Signed)
Patient ID: Connie Burgess, female   DOB: 1975/08/10, 37 y.o.   MRN: [id number] 37 y.o. G1P1001 Single Caucasian Fe here for annual exam.   Menses still monthly but getting lighter. Mini pad or with wiping only for about 3 days total. Still gets headaches before cycle start but not worse. Still some bloating. She notes a vaginal odor prior to her cycle that is not related to vaginal discharge for about 8 months.  wonders if this may be related to the pelvic floor relaxation problems she is having.  Ended last relationship about 2 months due to his ETOH use.  Patient's last menstrual period was 12/01/2012.          Sexually active: no  The current method of family planning is IUD. Mirena placement 03/07/11   Exercising: no  The patient does not participate in regular exercise at present. Smoker:  no  Health Maintenance: Pap:  ASCUS, neg HR HPV  Colonoscopy:   TDaP:  2012 Labs: HB: 14.6 Urine: trace leuk's, trace blood, pH 5.0   reports that she has never smoked. She has never used smokeless tobacco. She reports that she drinks about 1.2 ounces of alcohol per week. She reports that she does not use illicit drugs.  Past Medical History  Diagnosis Date  . Migraine   . Endometriosis     Past Surgical History  Procedure Laterality Date  . Bunionectomy  2004;2008/2009    bilat.   . Tonsillectomy    . Abdominal exploration surgery  07/2010  . Tubal ligation  2012    Current Outpatient Prescriptions  Medication Sig Dispense Refill  . baclofen (LIORESAL) 10 MG tablet Take 10 mg by mouth as needed.      . eletriptan (RELPAX) 20 MG tablet One tablet by mouth at onset of headache. May repeat in 2 hours if headache persists or recurs. may repeat in 2 hours if necessary      . levothyroxine (SYNTHROID, LEVOTHROID) 125 MCG tablet TAKE TWO TABLETS BY MOUTH DAILY  60 tablet  1  . topiramate (TOPAMAX) 100 MG tablet Take 200 mg by mouth daily.        No current facility-administered medications  for this visit.    Family History  Problem Relation Age of Onset  . Allergies Mother   . Allergies Father   . Other Father     Non Hodgkins Lymphoma  . Allergies Other     siblings  . Endometriosis Sister   . Endometriosis Maternal Aunt     ROS:  Pertinent items are noted in HPI.  Otherwise, a comprehensive ROS was negative.  Exam:   BP 120/76  Pulse 64  Ht 5' 1.75" (1.568 m)  Wt 257 lb (116.574 kg)  BMI 47.41 kg/m2  LMP 12/01/2012 Height: 5' 1.75" (156.8 cm)  Ht Readings from Last 3 Encounters:  12/22/12 5' 1.75" (1.568 m)  11/21/11 5\' 2"  (1.575 m)  05/02/11 5\' 1"  (1.549 m)    General appearance: alert, cooperative and appears stated age Head: Normocephalic, without obvious abnormality, atraumatic Neck: no adenopathy, supple, symmetrical, trachea midline and thyroid normal to inspection and palpation Lungs: clear to auscultation bilaterally Breasts: normal appearance, no masses or tenderness Heart: regular rate and rhythm Abdomen: soft, non-tender; no masses,  no organomegaly Extremities: extremities normal, atraumatic, no cyanosis or edema Skin: Skin color, texture, turgor normal. No rashes or lesions Lymph nodes: Cervical, supraclavicular, and axillary nodes normal. No abnormal inguinal nodes palpated Neurologic: Grossly normal  Pelvic: External genitalia:  no lesions              Urethra:  normal appearing urethra with no masses, tenderness or lesions              Bartholin's and Skene's: normal                 Vagina: normal appearing vagina with normal color and no discharge, no lesions              Cervix: anteverted and IUD strings are visible.              Pap taken: yes Bimanual Exam:  Uterus:  normal size, contour, position, consistency, mobility, non-tender              Adnexa: no mass, fullness, tenderness               Rectovaginal: Confirms               Anus:  normal sphincter tone, no lesions  A:  Well Woman with normal exam  Mirena IUD  03/07/11  Pelvic floor weakness seeing Uro PT  Vaginal odor ? Cause - occurs prior to menses  Hypothyroid   P:   Pap smear as per guidelines Pap today  Trial of Metrogel vaginal gel 1-2 night premenstrual to see if vaginal odor is improved.  Counseled on breast self exam, adequate intake of calcium and vitamin D, diet and exercise, Kegel's exercises return annually or prn  An After Visit Summary was printed and given to the patient.

## 2012-12-26 NOTE — Progress Notes (Signed)
Encounter reviewed by Dr. Quinnetta Roepke Silva.  

## 2012-12-27 LAB — IPS PAP TEST WITH HPV

## 2012-12-28 ENCOUNTER — Other Ambulatory Visit: Payer: Self-pay | Admitting: Obstetrics & Gynecology

## 2012-12-28 ENCOUNTER — Telehealth: Payer: Self-pay | Admitting: Orthopedic Surgery

## 2012-12-28 DIAGNOSIS — IMO0002 Reserved for concepts with insufficient information to code with codable children: Secondary | ICD-10-CM

## 2012-12-28 NOTE — Telephone Encounter (Signed)
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 Questions answered and procedure explained. Pt has IUD but still has light periods. Pt expecting period any day now, and would like appt next week. Sched appt 01-03-13 at 4 pm with SM. Advised pt to take 800 mg of ibuprofen an hour before the appt with food to help with cramping. Advised Carolynn from insurance would be calling to discuss OOP cost. Pt agreeable.

## 2012-12-28 NOTE — Telephone Encounter (Signed)
Message copied by Alfredo Batty on Tue Dec 28, 2012  9:55 AM ------      Message from: Ria Comment R      Created: Tue Dec 28, 2012  8:33 AM       This patient needs to be informed of abnormal pap then needs to have Colpo Biopsy.  She has history of ASCUS with neg HPV last year and we discussed. 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  She also has Mirena IUD. ------

## 2012-12-29 ENCOUNTER — Telehealth: Payer: Self-pay | Admitting: Obstetrics & Gynecology

## 2012-12-29 NOTE — Telephone Encounter (Signed)
Call to patient in regards to PR for colpo. VM has not been set up. Unable to leave message.

## 2012-12-29 NOTE — Telephone Encounter (Signed)
Patient was returning Connie Burgess call.

## 2013-01-02 ENCOUNTER — Encounter: Payer: Self-pay | Admitting: Gynecology

## 2013-01-03 ENCOUNTER — Ambulatory Visit (INDEPENDENT_AMBULATORY_CARE_PROVIDER_SITE_OTHER): Payer: BC Managed Care – PPO | Admitting: Obstetrics & Gynecology

## 2013-01-03 VITALS — BP 118/78 | HR 58 | Resp 16 | Ht 61.75 in | Wt 259.2 lb

## 2013-01-03 DIAGNOSIS — R87619 Unspecified abnormal cytological findings in specimens from cervix uteri: Secondary | ICD-10-CM

## 2013-01-03 DIAGNOSIS — IMO0002 Reserved for concepts with insufficient information to code with codable children: Secondary | ICD-10-CM

## 2013-01-03 DIAGNOSIS — R6889 Other general symptoms and signs: Secondary | ICD-10-CM

## 2013-01-03 DIAGNOSIS — R87810 Cervical high risk human papillomavirus (HPV) DNA test positive: Secondary | ICD-10-CM

## 2013-01-03 NOTE — Progress Notes (Signed)
Subjective:     Patient ID: Connie Burgess, female   DOB: 1975/11/09, 37 y.o.   MRN: [id number]  HPI 37 yo G1P1 Separated WF with new onset of abnormal Pap smear--LGSIL with + HR HPV.  This is first abnormal ever for pt.  H/o divorce last year with new sex partner.  Not with him now and not currently sexually active.  Pap results, HPV findings discussed.  All questions.  Pt does have some anxiety about this results.  Review of Systems  All other systems reviewed and are negative.       Objective:   Physical Exam  Constitutional: She is oriented to person, place, and time. She appears well-developed and well-nourished.  Genitourinary: Vagina normal.    Neurological: She is alert and oriented to person, place, and time.  Skin: Skin is warm and dry.  Psychiatric: She has a normal mood and affect.     Patient's last menstrual period was 01/01/2013.  Contraception:  Mirena IUD   Blood pressure 118/78, pulse 58, resp. rate 16, height 5' 1.75" (1.568 m), weight 259 lb 3.2 oz (117.572 kg), last menstrual period 01/01/2013.  Procedure explained and patient's questions were invited and answered.  Consent form signed.    Role of HPV in genesis of SIL discussed with patient, and questions answered.   Speculum inserted atraumatically and cervix visualized.  3% acetic acid applied.  Cervix examined using 3.75 and 7.5   X magnification and green filter.  Lugol's also used on cervix.  Mosaicism noted around 5 o'clock and decreased staining noted from 11-1 o' clock.  Biopsies at 5 and 12 obtained.  Monsel's applied for excellent hemostasis.  Pt tolerated procedure well.        Assessment:     LGSIL, + HR HPV     Plan:     Colposcopic biopsies pending.  If CIN1 or less, will plan repeat pap and HR HPV 1 year.

## 2013-01-03 NOTE — Patient Instructions (Signed)

## 2013-01-06 LAB — IPS CERVICAL/ECC/EMB/VULVAR/VAGINAL BIOPSY

## 2013-01-07 ENCOUNTER — Telehealth: Payer: Self-pay | Admitting: Orthopedic Surgery

## 2013-01-07 NOTE — Telephone Encounter (Signed)
LMTCB for results. aa 

## 2013-01-07 NOTE — Telephone Encounter (Signed)
Spoke with pt about results showing mild dysplasia. Advised SM recommends a repeat Pap and HPV test on 1 year. Pt thinks she will be fine with this. Advised pt she can always call back if she changes her mind. Pt agreeable.

## 2013-01-07 NOTE — Telephone Encounter (Signed)
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  Needs repeat Pap and HR HPV 1 year.  Pt and I discussed the recommendations but she was very anxious and I told her I could do a Pap in six months if she wanted.  IF she has apprehension about waiting a year, Pap 6 months ok.  Thank you.  Please put in recall. ------

## 2013-01-27 ENCOUNTER — Other Ambulatory Visit (HOSPITAL_COMMUNITY): Payer: Self-pay | Admitting: Family Medicine

## 2013-03-21 ENCOUNTER — Other Ambulatory Visit (HOSPITAL_COMMUNITY): Payer: Self-pay | Admitting: Family Medicine

## 2013-03-21 NOTE — Telephone Encounter (Signed)
Not seen since EPIC 

## 2013-03-21 NOTE — Telephone Encounter (Signed)
One refill and NEEDS OV before more

## 2013-04-08 ENCOUNTER — Encounter: Payer: Self-pay | Admitting: Nurse Practitioner

## 2013-04-08 ENCOUNTER — Telehealth: Payer: Self-pay | Admitting: Nurse Practitioner

## 2013-04-08 ENCOUNTER — Ambulatory Visit (INDEPENDENT_AMBULATORY_CARE_PROVIDER_SITE_OTHER): Payer: BC Managed Care – PPO | Admitting: Nurse Practitioner

## 2013-04-08 VITALS — BP 120/76 | HR 72 | Ht 61.75 in | Wt 256.0 lb

## 2013-04-08 DIAGNOSIS — A499 Bacterial infection, unspecified: Secondary | ICD-10-CM

## 2013-04-08 DIAGNOSIS — Z113 Encounter for screening for infections with a predominantly sexual mode of transmission: Secondary | ICD-10-CM

## 2013-04-08 DIAGNOSIS — N760 Acute vaginitis: Secondary | ICD-10-CM

## 2013-04-08 DIAGNOSIS — B9689 Other specified bacterial agents as the cause of diseases classified elsewhere: Secondary | ICD-10-CM

## 2013-04-08 MED ORDER — FLUCONAZOLE 150 MG PO TABS: 150.0000 mg | ORAL_TABLET | Freq: Once | ORAL | Status: DC

## 2013-04-08 MED ORDER — METRONIDAZOLE 0.75 % VA GEL: 1.0000 | Freq: Every day | VAGINAL | Status: DC

## 2013-04-08 NOTE — Patient Instructions (Signed)
Monilial Vaginitis  Vaginitis in a soreness, swelling and redness (inflammation) of the vagina and vulva. Monilial vaginitis is not a sexually transmitted infection.  CAUSES   Yeast vaginitis is caused by yeast (candida) that is normally found in your vagina. With a yeast infection, the candida has overgrown in number to a point that upsets the chemical balance.  SYMPTOMS   · White, thick vaginal discharge.  · Swelling, itching, redness and irritation of the vagina and possibly the lips of the vagina (vulva).  · Burning or painful urination.  · Painful intercourse.  DIAGNOSIS   Things that may contribute to monilial vaginitis are:  · Postmenopausal and virginal states.  · Pregnancy.  · Infections.  · Being tired, sick or stressed, especially if you had monilial vaginitis in the past.  · Diabetes. Good control will help lower the chance.  · Birth control pills.  · Tight fitting garments.  · Using bubble bath, feminine sprays, douches or deodorant tampons.  · Taking certain medications that kill germs (antibiotics).  · Sporadic recurrence can occur if you become ill.  TREATMENT   Your caregiver will give you medication.  · There are several kinds of anti monilial vaginal creams and suppositories specific for monilial vaginitis. For recurrent yeast infections, use a suppository or cream in the vagina 2 times a week, or as directed.  · Anti-monilial or steroid cream for the itching or irritation of the vulva may also be used. Get your caregiver's permission.  · Painting the vagina with methylene blue solution may help if the monilial cream does not work.  · Eating yogurt may help prevent monilial vaginitis.  HOME CARE INSTRUCTIONS   · Finish all medication as prescribed.  · Do not have sex until treatment is completed or after your caregiver tells you it is okay.  · Take warm sitz baths.  · Do not douche.  · Do not use tampons, especially scented ones.  · Wear cotton underwear.  · Avoid tight pants and panty  hose.  · Tell your sexual partner that you have a yeast infection. They should go to their caregiver if they have symptoms such as mild rash or itching.  · Your sexual partner should be treated as well if your infection is difficult to eliminate.  · Practice safer sex. Use condoms.  · Some vaginal medications cause latex condoms to fail. Vaginal medications that harm condoms are:  · Cleocin cream.  · Butoconazole (Femstat®).  · Terconazole (Terazol®) vaginal suppository.  · Miconazole (Monistat®) (may be purchased over the counter).  SEEK MEDICAL CARE IF:   · You have a temperature by mouth above 102° F (38.9° C).  · The infection is getting worse after 2 days of treatment.  · The infection is not getting better after 3 days of treatment.  · You develop blisters in or around your vagina.  · You develop vaginal bleeding, and it is not your menstrual period.  · You have pain when you urinate.  · You develop intestinal problems.  · You have pain with sexual intercourse.  Document Released: 12/11/2004 Document Revised: 05/26/2011 Document Reviewed: 08/25/2008  [employer]® Patient Information ©2014 [employer].

## 2013-04-08 NOTE — Telephone Encounter (Signed)
Spoke with patient, she states she is concerned regarding STD's. Appointment scheduled.  Routing to provider for final review. Patient agreeable to disposition. Will close encounter

## 2013-04-08 NOTE — Telephone Encounter (Signed)
Patient wasn't to come in and see patty. States she just doesn't feel right/good.

## 2013-04-08 NOTE — Progress Notes (Signed)
Subjective:     Patient ID: Connie Burgess, female   DOB: [birth year], [age] y.o.   MRN: [id number]  HPI This 38 yo WD Fe presents for STD testing.  Feels like some increase in odor and itching, no discharge.  Symptoms present for 7 days.  She has used Metrogel for 5 nights thinking this was BV.  Last cream was used 2 days ago.  Had a new  partner for a month, then after SA  2 1/2 weeks ago he has since left and not contacted her. She feels rejected but also concerned about if he had some other problem.  No known exposure to STD's. She has Mirena IUD since 12/12 and has had a cycle every month but getting lighter.  Now with PMS symptoms of breast tenderness, acne and no cycle yet for this month.  She also has had BTL.  Review of Systems  Constitutional: Negative for fever, chills and fatigue.  Respiratory: Negative.   Cardiovascular: Negative.   Gastrointestinal: Negative.  Negative for nausea, vomiting, abdominal pain and diarrhea.  Genitourinary: Positive for vaginal discharge. Negative for dysuria, urgency, frequency, hematuria, flank pain, pelvic pain and dyspareunia.  Musculoskeletal: Negative.   Skin: Negative.   Neurological: Negative.   Psychiatric/Behavioral: Negative.        Objective:   Physical Exam  Constitutional: She is oriented to person, place, and time. She appears well-developed and well-nourished. No distress.  Abdominal: Soft. She exhibits no distension. There is no tenderness. There is no rebound and no guarding.  Genitourinary:  Thin yellow vaginal discharge.  IUD strings are visible. She has no lesions. No pain on bimanual exam. Wet Prep:  PH:5.5; NSS: few clue cells KOH: moderate yeast.  Neurological: She is alert and oriented to person, place, and time.  Psychiatric: She has a normal mood and affect. Her behavior is normal. Judgment and thought content normal.       Assessment:     BV and Yeast vaginitis R/O STD's S/P BTL  08/2009  Mirena IUD for  menorrhagia    Plan:     Will treat the yeast infection with Diflucan, then by mid week if symptoms still there will go ahead and do another course of Metrogel. Will call with STD's

## 2013-04-09 LAB — STD PANEL
HIV: NONREACTIVE
Hepatitis B Surface Ag: NEGATIVE

## 2013-04-13 LAB — IPS N GONORRHOEA AND CHLAMYDIA BY PCR

## 2013-04-13 NOTE — Progress Notes (Signed)
Reviewed personally.  M. Suzanne Jaydan Meidinger, MD.  

## 2013-04-21 ENCOUNTER — Ambulatory Visit (INDEPENDENT_AMBULATORY_CARE_PROVIDER_SITE_OTHER): Payer: BC Managed Care – PPO | Admitting: Family Medicine

## 2013-04-21 ENCOUNTER — Encounter: Payer: Self-pay | Admitting: Family Medicine

## 2013-04-21 VITALS — BP 116/70 | Temp 97.8°F | Ht 61.0 in | Wt 256.0 lb

## 2013-04-21 DIAGNOSIS — E039 Hypothyroidism, unspecified: Secondary | ICD-10-CM

## 2013-04-21 DIAGNOSIS — J069 Acute upper respiratory infection, unspecified: Secondary | ICD-10-CM

## 2013-04-21 MED ORDER — LEVOFLOXACIN 500 MG PO TABS: 500.0000 mg | ORAL_TABLET | Freq: Every day | ORAL | Status: DC

## 2013-04-21 MED ORDER — OSELTAMIVIR PHOSPHATE 75 MG PO CAPS: 75.0000 mg | ORAL_CAPSULE | Freq: Two times a day (BID) | ORAL | Status: DC

## 2013-04-21 NOTE — Progress Notes (Signed)
   Subjective:    Patient ID: Connie Burgess, female    DOB: 11-09-1975, 38 y.o.   MRN: [id number]  Otalgia  There is pain in the right ear. The current episode started yesterday. There has been no fever. Associated symptoms include coughing, headaches and rhinorrhea. Associated symptoms comments: Watery eyes. Treatments tried: sudafed, allergy meds.   Patient does relate some sinus pressure drainage coughing denies wheezing or difficulty   Review of Systems  Constitutional: Negative for fever and activity change.  HENT: Positive for congestion, ear pain and rhinorrhea.   Eyes: Negative for discharge.  Respiratory: Positive for cough. Negative for shortness of breath and wheezing.   Cardiovascular: Negative for chest pain.  Neurological: Positive for headaches.       Objective:   Physical Exam  Nursing note and vitals reviewed. Constitutional: She appears well-developed.  HENT:  Head: Normocephalic.  Nose: Nose normal.  Mouth/Throat: Oropharynx is clear and moist. No oropharyngeal exudate.  Neck: Neck supple.  Cardiovascular: Normal rate and normal heart sounds.   No murmur heard. Pulmonary/Chest: Effort normal and breath sounds normal. She has no wheezes.  Lymphadenopathy:    She has no cervical adenopathy.  Skin: Skin is warm and dry.          Assessment & Plan:  Viral syndrome should gradually get better warning signs discussed  If progressive symptoms that point toward sinus infection then the next step would be antibiotics prescription given just in case also if symptoms progress toward the flu then a prescription for Tamiflu was also given. Followup accordingly.  Patient has history hypothyroidism is been some time since she had a thyroid testing this was ordered.

## 2013-04-27 ENCOUNTER — Encounter: Payer: Self-pay | Admitting: Family Medicine

## 2013-04-27 LAB — TSH: TSH: 3.819 u[IU]/mL (ref 0.350–4.500)

## 2013-04-27 LAB — T4, FREE: FREE T4: 1.24 ng/dL (ref 0.80–1.80)

## 2013-05-03 ENCOUNTER — Telehealth: Payer: Self-pay | Admitting: Nurse Practitioner

## 2013-05-03 MED ORDER — TERCONAZOLE 0.4 % VA CREA: 1.0000 | TOPICAL_CREAM | Freq: Every day | VAGINAL | Status: AC

## 2013-05-03 NOTE — Telephone Encounter (Signed)
Return call to patient.  She reports that she was treated with Diflucan 2 weeks ago for yeast.  Has taken 2 Diflucan three days apart and symptoms did resolve for a few days and are now returning.  Reports vaginal discharge that is sometimes white or yellow. Has a "sweet" odor, not fishy, and discharge is "clumpy".  Has itching but denies pain fever or urinary issues.  Advised will check with provider since she was recently evaluated but may still need OV.

## 2013-05-03 NOTE — Telephone Encounter (Signed)
Give patient a course of Terazol vaginal cream HS  X 7 since this sounds more like yeast than BV.

## 2013-05-03 NOTE — Telephone Encounter (Signed)
Pt notified.  She is agreeable with cream.  This is sent to pharmacy.

## 2013-05-03 NOTE — Telephone Encounter (Signed)
Patient calling stating the same symptoms of a yeast infection are back. She saw Patty recently for this and declined an appointment until she speaks with nurse. [facility]  [phone]

## 2013-05-06 ENCOUNTER — Other Ambulatory Visit (HOSPITAL_COMMUNITY): Payer: Self-pay | Admitting: Family Medicine

## 2013-05-23 ENCOUNTER — Encounter: Payer: Self-pay | Admitting: Nurse Practitioner

## 2013-05-23 ENCOUNTER — Ambulatory Visit (INDEPENDENT_AMBULATORY_CARE_PROVIDER_SITE_OTHER): Payer: BC Managed Care – PPO | Admitting: Nurse Practitioner

## 2013-05-23 VITALS — BP 96/64 | HR 78 | Resp 16 | Wt 253.0 lb

## 2013-05-23 DIAGNOSIS — B3731 Acute candidiasis of vulva and vagina: Secondary | ICD-10-CM

## 2013-05-23 DIAGNOSIS — B373 Candidiasis of vulva and vagina: Secondary | ICD-10-CM

## 2013-05-23 MED ORDER — FLUCONAZOLE 150 MG PO TABS: 150.0000 mg | ORAL_TABLET | Freq: Once | ORAL | Status: DC

## 2013-05-23 NOTE — Progress Notes (Signed)
Subjective:     Patient ID: Connie Burgess, female   DOB: 06-20-1975, 38 y.o.   MRN: [id number]  HPI  This 38 yo SW Fe complains of thick yellow discharge a week ago.  Now discharge is more thin and clear. Some itching has persisted and now has an odor.  LMP 2/24 X 5 days.  She had been treated with yeast and BV last month.  After the course of Terazol  she then was treated with a course of Metrogel.   Then Last week with BTB X 3 days.  She has Mirena IUD in place and gets regular cycles.  She has been talking to a new partner for awhile but not SA until last week.  This was after the BTB and after these yeast symptoms seem to be started again. She denies fever or chills.   Review of Systems  Constitutional: Negative for fever, chills and fatigue.  Respiratory: Negative.   Cardiovascular: Negative.   Gastrointestinal: Negative.   Genitourinary: Positive for vaginal discharge and menstrual problem. Negative for dysuria, urgency, frequency, hematuria, flank pain, decreased urine volume, pelvic pain and dyspareunia.  Musculoskeletal: Negative.   Skin: Negative.   Neurological: Negative.   Psychiatric/Behavioral: Negative.        Objective:   Physical Exam  Constitutional: She is oriented to person, place, and time. She appears well-developed and well-nourished. No distress.  Abdominal: Soft. She exhibits no distension. There is no tenderness. There is no rebound and no guarding.  Genitourinary:  Thick yellow vaginal discharge. No cervicitis.  IUD strings are visible.  No pain on bimanual exam.  Wet Prep: ph: 4.0; NSS: no clue or trich; KOH: + yeast.  Neurological: She is alert and oriented to person, place, and time.  Psychiatric: She has a normal mood and affect. Her behavior is normal. Judgment and thought content normal.       Assessment:     Yeast vaginitis that may have just flared secondary to use of Metrogel or newly SA X 1 week.    Plan:    Diflucan 150 mg X 2 with a refill.   She may try the other 2 tablets a week apart in order to treat chronic symptoms.  Since STD's were done a bout a month ago did not repeat at this time but will if symptoms continue.

## 2013-05-23 NOTE — Patient Instructions (Signed)
Monilial Vaginitis  Vaginitis in a soreness, swelling and redness (inflammation) of the vagina and vulva. Monilial vaginitis is not a sexually transmitted infection.  CAUSES   Yeast vaginitis is caused by yeast (candida) that is normally found in your vagina. With a yeast infection, the candida has overgrown in number to a point that upsets the chemical balance.  SYMPTOMS   · White, thick vaginal discharge.  · Swelling, itching, redness and irritation of the vagina and possibly the lips of the vagina (vulva).  · Burning or painful urination.  · Painful intercourse.  DIAGNOSIS   Things that may contribute to monilial vaginitis are:  · Postmenopausal and virginal states.  · Pregnancy.  · Infections.  · Being tired, sick or stressed, especially if you had monilial vaginitis in the past.  · Diabetes. Good control will help lower the chance.  · Birth control pills.  · Tight fitting garments.  · Using bubble bath, feminine sprays, douches or deodorant tampons.  · Taking certain medications that kill germs (antibiotics).  · Sporadic recurrence can occur if you become ill.  TREATMENT   Your caregiver will give you medication.  · There are several kinds of anti monilial vaginal creams and suppositories specific for monilial vaginitis. For recurrent yeast infections, use a suppository or cream in the vagina 2 times a week, or as directed.  · Anti-monilial or steroid cream for the itching or irritation of the vulva may also be used. Get your caregiver's permission.  · Painting the vagina with methylene blue solution may help if the monilial cream does not work.  · Eating yogurt may help prevent monilial vaginitis.  HOME CARE INSTRUCTIONS   · Finish all medication as prescribed.  · Do not have sex until treatment is completed or after your caregiver tells you it is okay.  · Take warm sitz baths.  · Do not douche.  · Do not use tampons, especially scented ones.  · Wear cotton underwear.  · Avoid tight pants and panty  hose.  · Tell your sexual partner that you have a yeast infection. They should go to their caregiver if they have symptoms such as mild rash or itching.  · Your sexual partner should be treated as well if your infection is difficult to eliminate.  · Practice safer sex. Use condoms.  · Some vaginal medications cause latex condoms to fail. Vaginal medications that harm condoms are:  · Cleocin cream.  · Butoconazole (Femstat®).  · Terconazole (Terazol®) vaginal suppository.  · Miconazole (Monistat®) (may be purchased over the counter).  SEEK MEDICAL CARE IF:   · You have a temperature by mouth above 102° F (38.9° C).  · The infection is getting worse after 2 days of treatment.  · The infection is not getting better after 3 days of treatment.  · You develop blisters in or around your vagina.  · You develop vaginal bleeding, and it is not your menstrual period.  · You have pain when you urinate.  · You develop intestinal problems.  · You have pain with sexual intercourse.  Document Released: 12/11/2004 Document Revised: 05/26/2011 Document Reviewed: 08/25/2008  [employer]® Patient Information ©2014 [employer].

## 2013-05-24 NOTE — Progress Notes (Signed)
Encounter reviewed by Dr. Mohamed Portlock Silva.  

## 2013-05-27 ENCOUNTER — Telehealth: Payer: Self-pay | Admitting: Nurse Practitioner

## 2013-05-27 NOTE — Telephone Encounter (Signed)
I agree with the recommendations.

## 2013-05-27 NOTE — Telephone Encounter (Signed)
Call to patient.  Has taken Diflucan on Monday and Wednesday this week and still has itching an yellowish clumpy discharge.  Advised to wait on taking any further Diflucan till at least Monday.  Try hydrocortisone ointment (OTC) externally BID and keep area clean and dry.  If not improving by Monday, should call for OV and possible Affirm before proceeding with more Diflucan, if it isnt helping.   Patient denies pain,fever, or odor to vaginal discharge.  Connie Burgess out of office today, advised will review with Dr Edward JollySilva and call her back for any further directions.  Please advise.

## 2013-05-27 NOTE — Telephone Encounter (Signed)
Patient has taken 2 doses of the diflucan already and she is still having symptoms. She wants to talk to a nurse

## 2013-05-27 NOTE — Telephone Encounter (Signed)
Encounter closed

## 2013-06-16 ENCOUNTER — Telehealth: Payer: Self-pay | Admitting: Nurse Practitioner

## 2013-06-23 ENCOUNTER — Ambulatory Visit (INDEPENDENT_AMBULATORY_CARE_PROVIDER_SITE_OTHER): Payer: BC Managed Care – PPO | Admitting: Nurse Practitioner

## 2013-06-23 ENCOUNTER — Encounter: Payer: Self-pay | Admitting: Nurse Practitioner

## 2013-06-23 VITALS — BP 114/66 | HR 60 | Ht 61.75 in | Wt 252.0 lb

## 2013-06-23 DIAGNOSIS — N898 Other specified noninflammatory disorders of vagina: Secondary | ICD-10-CM

## 2013-06-23 NOTE — Progress Notes (Signed)
Subjective:     Patient ID: Connie Burgess, female   DOB: April 17, 1975, 38 y.o.   MRN: [id number]  HPI  HPI This 38 yo SW Fe complains of thick yellow to white discharge that is intermittent. Now discharge is more thin and clear. Some itching has persisted and now has an odor. LMP 3/25 . She had been treated with yeast and BV last month.  She has now completed Diflucan X 3 doses and symptoms persist. She has noted BTB X last 2 months the week before her cycle starts.  She has Mirena IUD in place and gets regular cycles. She has been talking to a new partner for awhile but not SA until last week. This was after the BTB and after these yeast symptoms seem to be started again. She denies fever or chills.she is frustrated that symptoms of discharge and odor that she feels is bothersome.    Review of Systems  Constitutional: Negative for fever, chills and fatigue.  Genitourinary: Positive for vaginal discharge and menstrual problem. Negative for dysuria, urgency, frequency, hematuria, flank pain, decreased urine volume, vaginal bleeding, genital sores, vaginal pain, pelvic pain and dyspareunia.  Neurological: Negative.   Psychiatric/Behavioral: Negative.        Objective:   Physical Exam  Constitutional: She appears well-developed and well-nourished.  Abdominal: Soft. She exhibits no distension. There is no tenderness. There is no rebound and no guarding.  Genitourinary:  Only very small amount of clear thin vaginal discharge is seen - Affirm test is done. IUD strings are visible and in place.       Assessment:     R/O chronic yeast / BV    Plan:     Will hold treatment until test results are back

## 2013-06-23 NOTE — Patient Instructions (Signed)
We will call or send results via My Chart

## 2013-06-24 ENCOUNTER — Other Ambulatory Visit: Payer: Self-pay | Admitting: Nurse Practitioner

## 2013-06-24 LAB — WET PREP BY MOLECULAR PROBE
CANDIDA SPECIES: NEGATIVE
GARDNERELLA VAGINALIS: POSITIVE — AB
Trichomonas vaginosis: NEGATIVE

## 2013-06-24 MED ORDER — METRONIDAZOLE 0.75 % VA GEL: 1.0000 | Freq: Every day | VAGINAL | Status: DC

## 2013-06-28 ENCOUNTER — Other Ambulatory Visit (HOSPITAL_COMMUNITY): Payer: Self-pay | Admitting: Family Medicine

## 2013-06-28 NOTE — Progress Notes (Signed)
Encounter reviewed by Dr. Brook Silva.  

## 2013-09-28 ENCOUNTER — Other Ambulatory Visit (HOSPITAL_COMMUNITY): Payer: Self-pay | Admitting: Family Medicine

## 2013-10-13 ENCOUNTER — Telehealth: Payer: Self-pay | Admitting: Nurse Practitioner

## 2013-10-13 NOTE — Telephone Encounter (Signed)
Patient thinks she has a yeast inf or bacterial inf. York SpanielSaid she is out of town and will not be able to come in for an appt until Tuesday. Whats to know what we suggest.

## 2013-10-13 NOTE — Telephone Encounter (Signed)
Spoke with patient. Patient states that she has been experiencing "odor and discharge." Patient states that she has a chronic history of yeast and BV. Denies itching and burning. Discharge is yellow and white in color. Patient would like to know what she should do. Advised patient that she can use OTC Monistat day 3 or 7 to see if this will relieve symptoms. If not patient will need to be seen at local Urgent care since she is out of town for prescription as there is no OTC for BV. Patient states that she does [place]ist[place]. "I just really don't know what to do." Advised patient would send a message over to Lauro FranklinPatricia Rolen-Grubb, FNP and give patient a call back with further instructions and recommendations. Patient is agreeable and verbalizes understanding.  Lauro FranklinPatricia Rolen-Grubb, FNP per patient's last visit on 4/9 patient was treated one month prior for yeast and BV. Patient used 3 doses of diflucan and symptoms were still occuring. Patient was treated for BV on 4/9 visit. Per chart has history of chronic yeast and BV. Please advise.

## 2013-10-13 NOTE — Telephone Encounter (Signed)
If she decides to come in will do Affirm testing

## 2013-10-13 NOTE — Telephone Encounter (Signed)
Spoke with patient. Advised spoke with Lauro FranklinPatricia Rolen-Grubb, FNP and she recommends patient to try the Monistat 3 or 7, be seen at an urgent care close by, or try VH essential insert 1 per day for 6 days. Patient would like to try VH essential or monistat at this time and will call back if having further symptoms to come in for affirm test.   Routing to provider for final review. Patient agreeable to disposition. Will close encounter

## 2013-10-17 ENCOUNTER — Encounter: Payer: Self-pay | Admitting: Nurse Practitioner

## 2013-10-17 ENCOUNTER — Ambulatory Visit (INDEPENDENT_AMBULATORY_CARE_PROVIDER_SITE_OTHER): Payer: BC Managed Care – PPO | Admitting: Nurse Practitioner

## 2013-10-17 VITALS — BP 120/76 | HR 84 | Ht 61.75 in | Wt 262.0 lb

## 2013-10-17 DIAGNOSIS — N898 Other specified noninflammatory disorders of vagina: Secondary | ICD-10-CM

## 2013-10-17 NOTE — Progress Notes (Signed)
Subjective:     Patient ID: Connie Burgess, female   DOB: Apr 28, 1975, 38 y.o.   MRN: [id number]  HPI  This 38 yo DW Fe presents with again a vaginal discharge X 1 week.  She has been treated for both BV and yeast and will respond to treatment  But symptoms come right back.  Because of the chronic nature of these symptoms she is asked to come in for Affirm testing.   S/P BTL for birth control.  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  She is also very stressed about her 38 yo daughter who may end up living her in [place] with the father.   Her choice would be to take the daughter with her.   Review of Systems  Constitutional: Negative for fever, chills and fatigue.  Gastrointestinal: Negative.   Genitourinary: Positive for vaginal discharge. Negative for dysuria, urgency, frequency, hematuria, flank pain, vaginal pain and pelvic pain.  Musculoskeletal: Negative.   Neurological: Negative.   Psychiatric/Behavioral: Negative.        Objective:   Physical Exam  Constitutional: She is oriented to person, place, and time. She appears well-developed and well-nourished. No distress.  Abdominal: Soft. She exhibits no distension. There is no tenderness. There is no rebound and no guarding.  Genitourinary:  Thin white vaginal discharge.  Affirm test is done. No pain on bimanual.  Neurological: She is alert and oriented to person, place, and time.  Psychiatric:  Feels tense today since she has to discuss plans about her daughter with ex husband.       Assessment:     Vaginal discharge - acute and chronic Emotional feelings about daughter    Plan:     Will do the Affirm testing today Hold treatment until results Support is given

## 2013-10-18 ENCOUNTER — Other Ambulatory Visit: Payer: Self-pay | Admitting: Nurse Practitioner

## 2013-10-18 LAB — WET PREP BY MOLECULAR PROBE
Candida species: NEGATIVE
Gardnerella vaginalis: NEGATIVE
Trichomonas vaginosis: NEGATIVE

## 2013-10-18 MED ORDER — METRONIDAZOLE 500 MG PO TABS: 500.0000 mg | ORAL_TABLET | Freq: Two times a day (BID) | ORAL | Status: AC

## 2013-10-23 NOTE — Progress Notes (Signed)
Encounter reviewed by Dr. Shakea Isip Silva.  

## 2013-12-29 ENCOUNTER — Ambulatory Visit: Payer: BC Managed Care – PPO | Admitting: Nurse Practitioner

## 2014-01-16 ENCOUNTER — Encounter: Payer: Self-pay | Admitting: Nurse Practitioner

## 2014-07-25 ENCOUNTER — Telehealth: Payer: Self-pay | Admitting: *Deleted

## 2014-07-25 NOTE — Telephone Encounter (Signed)
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  Please call pt to schedule AEX.  Thank you.

## 2014-07-26 NOTE — Telephone Encounter (Signed)
Left Voicemail to call back re: Needs AEX 

## 2014-08-01 NOTE — Telephone Encounter (Signed)
Second attempt to contact patient. Left Voicemail to call back to schedule AEX.  

## 2014-08-09 ENCOUNTER — Encounter: Payer: Self-pay | Admitting: *Deleted

## 2014-08-09 NOTE — Telephone Encounter (Signed)
Letter will be signed and on your desk for mailing.  Then can remove from recall and close encounter. 

## 2014-08-09 NOTE — Telephone Encounter (Signed)
Pt has not returned call or scheduled AEX.  Letter created.  Please advise recall. 

## 2014-08-11 NOTE — Telephone Encounter (Signed)
Letter mailed and marked as sent.  Pt removed from recall.  Closing encounter.
